# Patient Record
Sex: Female | Born: 2000
Health system: Southern US, Community
[De-identification: ages and names within clinical notes are randomized; demographics above are authoritative.]

## PROBLEM LIST (undated history)

## (undated) DIAGNOSIS — F419 Anxiety disorder, unspecified: Secondary | ICD-10-CM

## (undated) DIAGNOSIS — H93239 Hyperacusis, unspecified ear: Secondary | ICD-10-CM

## (undated) DIAGNOSIS — J45909 Unspecified asthma, uncomplicated: Secondary | ICD-10-CM

## (undated) DIAGNOSIS — T7840XA Allergy, unspecified, initial encounter: Secondary | ICD-10-CM

## (undated) DIAGNOSIS — H9325 Central auditory processing disorder: Secondary | ICD-10-CM

## (undated) DIAGNOSIS — I7300 Raynaud's syndrome without gangrene: Secondary | ICD-10-CM

## (undated) DIAGNOSIS — R413 Other amnesia: Secondary | ICD-10-CM

## (undated) HISTORY — DX: Central auditory processing disorder: H93.25

## (undated) HISTORY — DX: Hyperacusis, unspecified ear: H93.239

## (undated) HISTORY — DX: Unspecified asthma, uncomplicated: J45.909

## (undated) HISTORY — DX: Raynaud's syndrome without gangrene: I73.00

## (undated) HISTORY — DX: Anxiety disorder, unspecified: F41.9

## (undated) HISTORY — DX: Allergy, unspecified, initial encounter: T78.40XA

## (undated) HISTORY — DX: Other amnesia: R41.3

---
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
## 2020-04-22 ENCOUNTER — Telehealth: Payer: Self-pay | Admitting: Family Medicine

## 2020-04-22 MED ORDER — SERTRALINE HCL 50 MG PO TABS: 50.0000 mg | ORAL_TABLET | Freq: Every day | ORAL | 0 refills | Status: DC

## 2020-04-22 NOTE — Telephone Encounter (Signed)
  Prescription Request  04/22/2020  What is the name of the medication or equipment? sertraline (ZOLOFT) 50 MG tablet  Have you contacted your pharmacy to request a refill? (if applicable) no pt needs new rx because she left her medication and realized it when she got home from vacation.  Which pharmacy would you like this sent to? [facility] in [place]   Patient notified that their request is being sent to the clinical staff for review and that they should receive a response within 2 business days.

## 2020-04-23 ENCOUNTER — Other Ambulatory Visit: Payer: Self-pay | Admitting: Family Medicine

## 2020-04-23 MED ORDER — SERTRALINE HCL 50 MG PO TABS: 50.0000 mg | ORAL_TABLET | Freq: Every day | ORAL | 0 refills | Status: DC

## 2020-04-23 NOTE — Telephone Encounter (Signed)
Appears prescription has already been sent.

## 2020-04-23 NOTE — Telephone Encounter (Signed)
Zoloft resent to pharmacy

## 2020-05-20 ENCOUNTER — Other Ambulatory Visit: Payer: Self-pay | Admitting: Family Medicine

## 2020-05-22 ENCOUNTER — Telehealth: Payer: Self-pay | Admitting: Family Medicine

## 2020-05-22 DIAGNOSIS — F411 Generalized anxiety disorder: Secondary | ICD-10-CM

## 2020-05-22 MED ORDER — SERTRALINE HCL 50 MG PO TABS
50.0000 mg | ORAL_TABLET | Freq: Every day | ORAL | 0 refills | Status: DC
Start: 2020-05-22 — End: 2020-08-19

## 2020-05-22 NOTE — Telephone Encounter (Signed)
Changed rx to 90 days and mother notified

## 2020-05-22 NOTE — Telephone Encounter (Signed)
Colleen Mason is asking for a 90 day supply for sertraline (ZOLOFT) 50 MG tablet. Pt says its a insurance issue and needs to be changed. Please call [facility] in [place] to fix.

## 2020-06-26 ENCOUNTER — Ambulatory Visit (INDEPENDENT_AMBULATORY_CARE_PROVIDER_SITE_OTHER): Payer: 59 | Admitting: Family Medicine

## 2020-06-26 VITALS — BP 114/72 | HR 96 | Temp 97.7°F

## 2020-06-26 DIAGNOSIS — J310 Chronic rhinitis: Secondary | ICD-10-CM

## 2020-06-26 DIAGNOSIS — J329 Chronic sinusitis, unspecified: Secondary | ICD-10-CM | POA: Diagnosis not present

## 2020-06-26 MED ORDER — AZELASTINE HCL 0.1 % NA SOLN: 1.0000 | Freq: Two times a day (BID) | NASAL | 12 refills | Status: DC

## 2020-06-26 NOTE — Patient Instructions (Signed)

## 2020-06-26 NOTE — Progress Notes (Signed)
Subjective: CC: URI PCP: Gwenlyn Fudge, FNP XFG:HWEX Werst is a 19 y.o. female presenting to clinic today for:  1. URI Patient reports onset of symptoms on Monday.  She has had rhinorrhea, throat irritation and fever as high as 102 F.  She reports that symptoms seem to be improving but she still has drainage.  She was reluctant to go back to work.  She is not vaccinated.  She has been using all of her antihistamines and using Tylenol if needed.   ROS: Per HPI  Allergies  Allergen Reactions  . Penicillins Hives   Past Medical History:  Diagnosis Date  . Allergy   . Anxiety   . Asthma   . Auditory processing disorder   . Hyperacusis   . Memory difficulty    tolerance fading memory per mother  . Raynaud disease     Current Outpatient Medications:  .  albuterol (VENTOLIN HFA) 108 (90 Base) MCG/ACT inhaler, Inhale 2 puffs into the lungs every 6 (six) hours as needed for wheezing or shortness of breath., Disp: 18 g, Rfl: 5 .  Fluticasone Propionate, Inhal, (FLOVENT DISKUS) 100 MCG/BLIST AEPB, Inhale 1 Inhaler into the lungs 2 (two) times daily., Disp: 180 each, Rfl: 2 .  loratadine (CLARITIN) 10 MG tablet, Take 1 tablet (10 mg total) by mouth daily., Disp: 90 tablet, Rfl: 3 .  montelukast (SINGULAIR) 10 MG tablet, Take 1 tablet (10 mg total) by mouth at bedtime., Disp: 90 tablet, Rfl: 3 .  sertraline (ZOLOFT) 50 MG tablet, Take 1 tablet (50 mg total) by mouth daily., Disp: 90 tablet, Rfl: 3 .  sertraline (ZOLOFT) 50 MG tablet, Take 1 tablet (50 mg total) by mouth daily. (Needs to be seen before next refill), Disp: 90 tablet, Rfl: 0 Social History   Socioeconomic History  . Marital status: Single    Spouse name: Not on file  . Number of children: Not on file  . Years of education: Not on file  . Highest education level: Not on file  Occupational History  . Not on file  Tobacco Use  . Smoking status: Never Smoker  . Smokeless tobacco: Never Used  Vaping Use  . Vaping  Use: Never used  Substance and Sexual Activity  . Alcohol use: Never  . Drug use: Never  . Sexual activity: Not on file  Other Topics Concern  . Not on file  Social History Narrative  . Not on file   Social Determinants of Health   Financial Resource Strain:   . Difficulty of Paying Living Expenses: Not on file  Food Insecurity:   . Worried About Programme researcher, broadcasting/film/video in the Last Year: Not on file  . Ran Out of Food in the Last Year: Not on file  Transportation Needs:   . Lack of Transportation (Medical): Not on file  . Lack of Transportation (Non-Medical): Not on file  Physical Activity:   . Days of Exercise per Week: Not on file  . Minutes of Exercise per Session: Not on file  Stress:   . Feeling of Stress : Not on file  Social Connections:   . Frequency of Communication with Friends and Family: Not on file  . Frequency of Social Gatherings with Friends and Family: Not on file  . Attends Religious Services: Not on file  . Active Member of Clubs or Organizations: Not on file  . Attends Banker Meetings: Not on file  . Marital Status: Not on file  Intimate  Partner Violence:   . Fear of Current or Ex-Partner: Not on file  . Emotionally Abused: Not on file  . Physically Abused: Not on file  . Sexually Abused: Not on file   Family History  Problem Relation Age of Onset  . Raynaud syndrome Mother   . Asthma Mother   . Arthritis Mother        Psoriatic or RA  . Asthma Father   . Raynaud syndrome Sister   . Vitiligo Brother   . Arthritis Maternal Grandmother   . Thyroid disease Maternal Grandfather   . Rheum arthritis Maternal Grandfather   . Heart attack Maternal Grandfather   . Heart disease Maternal Grandfather   . Pancreatic cancer Paternal Grandmother   . Diabetes Paternal Grandmother   . Prostate cancer Paternal Grandfather   . Diabetes Paternal Grandfather   . Breast cancer Other   . Diabetes Other   . Cancer Other        Unknown type  .  Diabetes Other   . Diabetes Maternal Aunt     Objective: Office vital signs reviewed. BP 114/72   Pulse 96   Temp 97.7 F (36.5 C) (Temporal)   Physical Examination:  General: Awake, alert, well nourished, nontoxic. No acute distress HEENT: Normal, sclera white, moist mucous membranes.  Oropharynx with mild erythema.  No tonsillitis.  Nasal turbinates are moist and mildly erythematous. Neck: No posterior cervical lymphadenopathy.  She has mild enlargement of the superior anterior cervical lymph nodes Cardio: regular rate and rhythm, S1S2 heard, no murmurs appreciated Pulm: clear to auscultation bilaterally, no wheezes, rhonchi or rales; normal work of breathing on room air  Assessment/ Plan: 19 y.o. female   1. Rhinosinusitis Check COVID-19.  Astelin added.  Symptoms are improving.  I gave her a work note excusing until her Covid test retuns - Novel Coronavirus, NAA ([facility]) - azelastine (ASTELIN) 0.1 % nasal spray; Place 1 spray into both nostrils 2 (two) times daily.  Dispense: 30 mL; Refill: 12   No orders of the defined types were placed in this encounter.  No orders of the defined types were placed in this encounter.    Raliegh Ip, DO [facility] [phone]

## 2020-06-28 LAB — NOVEL CORONAVIRUS, NAA

## 2020-07-01 ENCOUNTER — Other Ambulatory Visit: Payer: Self-pay

## 2020-07-01 DIAGNOSIS — J310 Chronic rhinitis: Secondary | ICD-10-CM

## 2020-07-01 DIAGNOSIS — J329 Chronic sinusitis, unspecified: Secondary | ICD-10-CM

## 2020-08-19 ENCOUNTER — Other Ambulatory Visit: Payer: Self-pay | Admitting: Family Medicine

## 2020-09-23 ENCOUNTER — Other Ambulatory Visit: Payer: Self-pay | Admitting: Family Medicine

## 2020-09-23 NOTE — Telephone Encounter (Signed)
Appointment scheduled.

## 2020-09-23 NOTE — Telephone Encounter (Signed)
Stacks. NTBS 30 days given 08/19/20

## 2020-10-10 ENCOUNTER — Other Ambulatory Visit: Payer: Self-pay

## 2020-10-10 ENCOUNTER — Encounter: Payer: Self-pay | Admitting: Family Medicine

## 2020-10-10 ENCOUNTER — Ambulatory Visit: Payer: 59 | Admitting: Family Medicine

## 2020-10-10 DIAGNOSIS — J302 Other seasonal allergic rhinitis: Secondary | ICD-10-CM

## 2020-10-10 DIAGNOSIS — J4540 Moderate persistent asthma, uncomplicated: Secondary | ICD-10-CM

## 2020-10-10 MED ORDER — SERTRALINE HCL 50 MG PO TABS
50.0000 mg | ORAL_TABLET | Freq: Every day | ORAL | 3 refills | Status: DC
Start: 2020-10-10 — End: 2021-01-15

## 2020-10-10 MED ORDER — MONTELUKAST SODIUM 10 MG PO TABS: 10.0000 mg | ORAL_TABLET | Freq: Every day | ORAL | 3 refills | Status: DC

## 2020-10-10 MED ORDER — FLOVENT DISKUS 100 MCG/BLIST IN AEPB: 1.0000 | INHALATION_SPRAY | Freq: Two times a day (BID) | RESPIRATORY_TRACT | 2 refills | Status: DC

## 2020-10-10 MED ORDER — LORATADINE 10 MG PO TABS: 10.0000 mg | ORAL_TABLET | Freq: Every day | ORAL | 3 refills | Status: DC

## 2020-10-10 MED ORDER — ALBUTEROL SULFATE HFA 108 (90 BASE) MCG/ACT IN AERS: 2.0000 | INHALATION_SPRAY | Freq: Four times a day (QID) | RESPIRATORY_TRACT | 3 refills | Status: DC | PRN

## 2020-10-10 NOTE — Progress Notes (Signed)
Subjective:  Patient ID: Colleen Mason, female    DOB: 06-11-2001  Age: 20 y.o. MRN: [id number]  CC: Medical Management of Chronic Issues   HPI [name] presents for follow-up on her allergy and asthma as well as chronic anxiety.  She has been using the Flovent as needed as well as the albuterol as needed she has no symptoms currently so she is not taking the Singulair.  She does continue the sertraline for the anxiety on a regular basis.  Her allergy and asthma symptoms tend to be seasonal.  More in the summer.    Depression screen [facility] 2/9 10/10/2020 12/06/2019 09/19/2019  Decreased Interest 0 0 0  Down, Depressed, Hopeless 0 0 0  PHQ - 2 Score 0 0 0  Altered sleeping - 0 -  Tired, decreased energy - 0 -  Change in appetite - 0 -  Feeling bad or failure about yourself  - 0 -  Trouble concentrating - 0 -  Moving slowly or fidgety/restless - 0 -  Suicidal thoughts - 0 -  PHQ-9 Score - 0 -    History Colleen Mason has a past medical history of Allergy, Anxiety, Asthma, Auditory processing disorder, Hyperacusis, Memory difficulty, and Raynaud disease.   She has no past surgical history on file.   Her family history includes Arthritis in her maternal grandmother and mother; Asthma in her father and mother; Breast cancer in an other family member; Cancer in an other family member; Diabetes in her maternal aunt, paternal grandfather, paternal grandmother, and other family members; Heart attack in her maternal grandfather; Heart disease in her maternal grandfather; Pancreatic cancer in her paternal grandmother; Prostate cancer in her paternal grandfather; Raynaud syndrome in her mother and sister; Rheum arthritis in her maternal grandfather; Thyroid disease in her maternal grandfather; Vitiligo in her brother.She reports that she has never smoked. She has never used smokeless tobacco. She reports that she does not drink alcohol and does not use drugs.    ROS Review of Systems  Constitutional:  Negative.   HENT: Negative.   Eyes: Negative for visual disturbance.  Respiratory: Negative for shortness of breath.   Cardiovascular: Negative for chest pain.  Gastrointestinal: Negative for abdominal pain.  Musculoskeletal: Negative for arthralgias.    Objective:  BP 111/68   Pulse 78   Temp 98.2 F (36.8 C) (Temporal)   Ht 5' 4.53" (1.639 m)   Wt 113 lb 3.2 oz (51.3 kg)   BMI 19.11 kg/m   BP Readings from Last 3 Encounters:  10/10/20 111/68  06/26/20 114/72  12/06/19 129/73    Wt Readings from Last 3 Encounters:  10/10/20 113 lb 3.2 oz (51.3 kg) (21 %, Z= -0.81)*  12/06/19 116 lb (52.6 kg) (29 %, Z= -0.54)*  09/19/19 115 lb 3.2 oz (52.3 kg) (29 %, Z= -0.56)*   * Growth percentiles are based on CDC (Girls, 2-20 Years) data.     Physical Exam Constitutional:      General: She is not in acute distress.    Appearance: She is well-developed and well-nourished.  Cardiovascular:     Rate and Rhythm: Normal rate and regular rhythm.  Pulmonary:     Breath sounds: Normal breath sounds.  Skin:    General: Skin is warm and dry.  Neurological:     Mental Status: She is alert and oriented to person, place, and time.  Psychiatric:        Mood and Affect: Mood and affect normal.  Assessment & Plan:   Azizi was seen today for medical management of chronic issues.  Diagnoses and all orders for this visit:  Moderate persistent asthma without complication -     montelukast (SINGULAIR) 10 MG tablet; Take 1 tablet (10 mg total) by mouth at bedtime. -     albuterol (VENTOLIN HFA) 108 (90 Base) MCG/ACT inhaler; Inhale 2 puffs into the lungs every 6 (six) hours as needed for wheezing or shortness of breath. -     Fluticasone Propionate, Inhal, (FLOVENT DISKUS) 100 MCG/BLIST AEPB; Inhale 1 Inhaler into the lungs 2 (two) times daily. -     loratadine (CLARITIN) 10 MG tablet; Take 1 tablet (10 mg total) by mouth daily.  Seasonal allergies -     loratadine (CLARITIN) 10 MG  tablet; Take 1 tablet (10 mg total) by mouth daily.  Other orders -     sertraline (ZOLOFT) 50 MG tablet; Take 1 tablet (50 mg total) by mouth daily. (Needs to be seen before next refill)       I have discontinued Miliani Wadsworth's azelastine. I am also having her maintain her sertraline, montelukast, albuterol, Flovent Diskus, and loratadine.  Allergies as of 10/10/2020      Reactions   Penicillins Hives      Medication List       Accurate as of October 10, 2020 11:59 PM. If you have any questions, ask your nurse or doctor.        STOP taking these medications   azelastine 0.1 % nasal spray Commonly known as: ASTELIN Stopped by: Mechele Claude, MD     TAKE these medications   albuterol 108 (90 Base) MCG/ACT inhaler Commonly known as: VENTOLIN HFA Inhale 2 puffs into the lungs every 6 (six) hours as needed for wheezing or shortness of breath.   Flovent Diskus 100 MCG/BLIST Aepb Generic drug: Fluticasone Propionate (Inhal) Inhale 1 Inhaler into the lungs 2 (two) times daily.   loratadine 10 MG tablet Commonly known as: CLARITIN Take 1 tablet (10 mg total) by mouth daily.   montelukast 10 MG tablet Commonly known as: SINGULAIR Take 1 tablet (10 mg total) by mouth at bedtime.   sertraline 50 MG tablet Commonly known as: ZOLOFT Take 1 tablet (50 mg total) by mouth daily. (Needs to be seen before next refill)        Follow-up: Return in about 1 year (around 10/10/2021).  Mechele Claude, M.D.

## 2020-10-13 ENCOUNTER — Encounter: Payer: Self-pay | Admitting: Family Medicine

## 2021-01-03 ENCOUNTER — Ambulatory Visit: Payer: 59 | Admitting: Nurse Practitioner

## 2021-01-03 ENCOUNTER — Encounter: Payer: Self-pay | Admitting: Nurse Practitioner

## 2021-01-03 ENCOUNTER — Other Ambulatory Visit: Payer: Self-pay

## 2021-01-03 VITALS — BP 109/74 | HR 106 | Temp 97.6°F | Ht 64.0 in | Wt 110.0 lb

## 2021-01-03 DIAGNOSIS — R21 Rash and other nonspecific skin eruption: Secondary | ICD-10-CM | POA: Diagnosis not present

## 2021-01-03 MED ORDER — PREDNISONE 10 MG (21) PO TBPK: ORAL_TABLET | ORAL | 0 refills | Status: DC

## 2021-01-03 MED ORDER — HYDROCORTISONE 1 % EX LOTN: 1.0000 "application " | TOPICAL_LOTION | Freq: Two times a day (BID) | CUTANEOUS | 0 refills | Status: DC

## 2021-01-03 NOTE — Patient Instructions (Signed)
Rash, Adult  A rash is a change in the color of your skin. A rash can also change the way your skin feels. There are many different conditions and factors that can cause a rash. Follow these instructions at home: The goal of treatment is to stop the itching and keep the rash from spreading. Watch for any changes in your symptoms. Let your doctor know about them. Follow these instructions to help with your condition: Medicine Take or apply over-the-counter and prescription medicines only as told by your doctor. These may include medicines:  To treat red or swollen skin (corticosteroid creams).  To treat itching.  To treat an allergy (oral antihistamines).  To treat very bad symptoms (oral corticosteroids).   Skin care  Put cool cloths (compresses) on the affected areas.  Do not scratch or rub your skin.  Avoid covering the rash. Make sure that the rash is exposed to air as much as possible. Managing itching and discomfort  Avoid hot showers or baths. These can make itching worse. A cold shower may help.  Try taking a bath with: ? Epsom salts. You can get these at your local pharmacy or grocery store. Follow the instructions on the package. ? Baking soda. Pour a small amount into the bath as told by your doctor. ? Colloidal oatmeal. You can get this at your local pharmacy or grocery store. Follow the instructions on the package.  Try putting baking soda paste onto your skin. Stir water into baking soda until it gets like a paste.  Try putting on a lotion that relieves itchiness (calamine lotion).  Keep cool and out of the sun. Sweating and being hot can make itching worse. General instructions  Rest as needed.  Drink enough fluid to keep your pee (urine) pale yellow.  Wear loose-fitting clothing.  Avoid scented soaps, detergents, and perfumes. Use gentle soaps, detergents, perfumes, and other cosmetic products.  Avoid anything that causes your rash. Keep a journal to help  track what causes your rash. Write down: ? What you eat. ? What cosmetic products you use. ? What you drink. ? What you wear. This includes jewelry.  Keep all follow-up visits as told by your doctor. This is important.   Contact a doctor if:  You sweat at night.  You lose weight.  You pee (urinate) more than normal.  You pee less than normal, or you notice that your pee is a darker color than normal.  You feel weak.  You throw up (vomit).  Your skin or the whites of your eyes look yellow (jaundice).  Your skin: ? Tingles. ? Is numb.  Your rash: ? Does not go away after a few days. ? Gets worse.  You are: ? More thirsty than normal. ? More tired than normal.  You have: ? New symptoms. ? Pain in your belly (abdomen). ? A fever. ? Watery poop (diarrhea). Get help right away if:  You have a fever and your symptoms suddenly get worse.  You start to feel mixed up (confused).  You have a very bad headache or a stiff neck.  You have very bad joint pains or stiffness.  You have jerky movements that you cannot control (seizure).  Your rash covers all or most of your body. The rash may or may not be painful.  You have blisters that: ? Are on top of the rash. ? Grow larger. ? Grow together. ? Are painful. ? Are inside your nose or mouth.  You have   a rash that: ? Looks like purple pinprick-sized spots all over your body. ? Has a "bull's eye" or looks like a target. ? Is red and painful, causes your skin to peel, and is not from being in the sun too long. Summary  A rash is a change in the color of your skin. A rash can also change the way your skin feels.  The goal of treatment is to stop the itching and keep the rash from spreading.  Take or apply over-the-counter and prescription medicines only as told by your doctor.  Contact a doctor if you have new symptoms or symptoms that get worse.  Keep all follow-up visits as told by your doctor. This is  important. This information is not intended to replace advice given to you by your health care provider. Make sure you discuss any questions you have with your health care provider. Document Revised: 12/16/2018 Document Reviewed: 03/28/2018 Elsevier Patient Education  2021 Elsevier Inc.  

## 2021-01-03 NOTE — Assessment & Plan Note (Signed)
Rash gradually resolving.  Symptoms present at least 4 weeks.  Prednisone taper, hydrocortisone cream.  Advised to take medication as prescribed.  Education provided with printed handouts given.  Rx sent to pharmacy.  Patient knows to follow-up with worsening symptoms.

## 2021-01-03 NOTE — Progress Notes (Signed)
Acute Office Visit  Subjective:    Patient ID: Colleen Mason, female    DOB: 2001-04-06, 20 y.o.   MRN: [id number]  Chief Complaint  Patient presents with  . Rash    Rash This is a new problem. The current episode started 1 to 4 weeks ago. The problem has been gradually improving since onset. The affected locations include the torso and left buttock. The rash is characterized by itchiness and redness. She was exposed to nothing. Pertinent negatives include no facial edema, fatigue, fever or shortness of breath. Past treatments include topical steroids. The treatment provided moderate relief. Her past medical history is significant for allergies and asthma.     Past Medical History:  Diagnosis Date  . Allergy   . Anxiety   . Asthma   . Auditory processing disorder   . Hyperacusis   . Memory difficulty    tolerance fading memory per mother  . Raynaud disease     History reviewed. No pertinent surgical history.  Family History  Problem Relation Age of Onset  . Raynaud syndrome Mother   . Asthma Mother   . Arthritis Mother        Psoriatic or RA  . Asthma Father   . Raynaud syndrome Sister   . Vitiligo Brother   . Arthritis Maternal Grandmother   . Thyroid disease Maternal Grandfather   . Rheum arthritis Maternal Grandfather   . Heart attack Maternal Grandfather   . Heart disease Maternal Grandfather   . Pancreatic cancer Paternal Grandmother   . Diabetes Paternal Grandmother   . Prostate cancer Paternal Grandfather   . Diabetes Paternal Grandfather   . Breast cancer Other   . Diabetes Other   . Cancer Other        Unknown type  . Diabetes Other   . Diabetes Maternal Aunt     Social History   Socioeconomic History  . Marital status: Single    Spouse name: Not on file  . Number of children: Not on file  . Years of education: Not on file  . Highest education level: Not on file  Occupational History  . Not on file  Tobacco Use  . Smoking status: Never Smoker   . Smokeless tobacco: Never Used  Vaping Use  . Vaping Use: Never used  Substance and Sexual Activity  . Alcohol use: Never  . Drug use: Never  . Sexual activity: Not on file  Other Topics Concern  . Not on file  Social History Narrative  . Not on file   Social Determinants of Health   Financial Resource Strain: Not on file  Food Insecurity: Not on file  Transportation Needs: Not on file  Physical Activity: Not on file  Stress: Not on file  Social Connections: Not on file  Intimate Partner Violence: Not on file    Outpatient Medications Prior to Visit  Medication Sig Dispense Refill  . albuterol (VENTOLIN HFA) 108 (90 Base) MCG/ACT inhaler Inhale 2 puffs into the lungs every 6 (six) hours as needed for wheezing or shortness of breath. 54 g 3  . Fluticasone Propionate, Inhal, (FLOVENT DISKUS) 100 MCG/BLIST AEPB Inhale 1 Inhaler into the lungs 2 (two) times daily. 180 each 2  . loratadine (CLARITIN) 10 MG tablet Take 1 tablet (10 mg total) by mouth daily. 90 tablet 3  . montelukast (SINGULAIR) 10 MG tablet Take 1 tablet (10 mg total) by mouth at bedtime. 90 tablet 3  . sertraline (ZOLOFT) 50 MG tablet  Take 1 tablet (50 mg total) by mouth daily. (Needs to be seen before next refill) 90 tablet 3   No facility-administered medications prior to visit.    Allergies  Allergen Reactions  . Penicillins Hives    Review of Systems  Constitutional: Negative for fatigue and fever.  Respiratory: Negative for shortness of breath.   Skin: Positive for rash.  All other systems reviewed and are negative.      Objective:    Physical Exam Vitals and nursing note reviewed.  Constitutional:      Appearance: Normal appearance.  HENT:     Head: Normocephalic.     Nose: Nose normal.  Eyes:     Conjunctiva/sclera: Conjunctivae normal.  Cardiovascular:     Rate and Rhythm: Normal rate and regular rhythm.     Pulses: Normal pulses.     Heart sounds: Normal heart sounds.  Pulmonary:      Effort: Pulmonary effort is normal.     Breath sounds: Normal breath sounds.  Abdominal:     General: Bowel sounds are normal.  Skin:    General: Skin is warm.     Findings: Erythema and rash present. Rash is urticarial.          Comments: Red area,   Neurological:     Mental Status: She is alert and oriented to person, place, and time.  Psychiatric:        Behavior: Behavior normal.     BP 109/74   Pulse (!) 106   Temp 97.6 F (36.4 C) (Temporal)   Ht 5\' 4"  (1.626 m)   Wt 110 lb (49.9 kg)   LMP 12/07/2020   SpO2 98%   BMI 18.88 kg/m  Wt Readings from Last 3 Encounters:  01/03/21 110 lb (49.9 kg) (15 %, Z= -1.04)*  10/10/20 113 lb 3.2 oz (51.3 kg) (21 %, Z= -0.81)*  12/06/19 116 lb (52.6 kg) (29 %, Z= -0.54)*   * Growth percentiles are based on CDC (Girls, 2-20 Years) data.    Health Maintenance Due  Topic Date Due  . Hepatitis C Screening  Never done  . HPV VACCINES (1 - 2-dose series) Never done  . HIV Screening  Never done       Topic Date Due  . HPV VACCINES (1 - 2-dose series) Never done        Assessment & Plan:   Problem List Items Addressed This Visit      Musculoskeletal and Integument   Rash - Primary    Rash gradually resolving.  Symptoms present at least 4 weeks.  Prednisone taper, hydrocortisone cream.  Advised to take medication as prescribed.  Education provided with printed handouts given.  Rx sent to pharmacy.  Patient knows to follow-up with worsening symptoms.      Relevant Medications   predniSONE (STERAPRED UNI-PAK 21 TAB) 10 MG (21) TBPK tablet   hydrocortisone 1 % lotion       Meds ordered this encounter  Medications  . predniSONE (STERAPRED UNI-PAK 21 TAB) 10 MG (21) TBPK tablet    Sig: 6 tablets day 1, 5 tablets day 2, 4 tablet day 3, 3 tablet day 4, 2 tablet day 5, 1 tablet day 6    Dispense:  21 tablet    Refill:  0    Order Specific Question:   Supervising Provider    Answer:   12/08/19 Raliegh Ip  .  hydrocortisone 1 % lotion    Sig: Apply 1 application  topically 2 (two) times daily.    Dispense:  118 mL    Refill:  0    Order Specific Question:   Supervising Provider    Answer:   Raliegh Ip [[id number]]     Daryll Drown, NP

## 2021-01-15 ENCOUNTER — Encounter: Payer: Self-pay | Admitting: Family Medicine

## 2021-01-15 ENCOUNTER — Ambulatory Visit: Payer: 59 | Admitting: Family Medicine

## 2021-01-15 ENCOUNTER — Other Ambulatory Visit: Payer: Self-pay

## 2021-01-15 VITALS — BP 106/68 | HR 89 | Temp 97.7°F | Ht 64.0 in | Wt 113.4 lb

## 2021-01-15 DIAGNOSIS — F411 Generalized anxiety disorder: Secondary | ICD-10-CM | POA: Diagnosis not present

## 2021-01-15 DIAGNOSIS — Z30011 Encounter for initial prescription of contraceptive pills: Secondary | ICD-10-CM | POA: Diagnosis not present

## 2021-01-15 LAB — PREGNANCY, URINE: Preg Test, Ur: NEGATIVE

## 2021-01-15 MED ORDER — SERTRALINE HCL 100 MG PO TABS: 100.0000 mg | ORAL_TABLET | Freq: Every day | ORAL | 3 refills | Status: DC

## 2021-01-15 MED ORDER — NORETHINDRONE ACET-ETHINYL EST 1-20 MG-MCG PO TABS
1.0000 | ORAL_TABLET | Freq: Every day | ORAL | 11 refills | Status: DC
Start: 2021-01-15 — End: 2021-12-16

## 2021-01-15 NOTE — Progress Notes (Signed)
Assessment & Plan:  1. Generalized anxiety disorder Uncontrolled. Sertraline increased from 50 mg to 100 mg.  - sertraline (ZOLOFT) 100 MG tablet; Take 1 tablet (100 mg total) by mouth daily.  Dispense: 30 tablet; Refill: 3  2. Encounter for prescription of oral contraceptives Started oral birth control. Discussed appropriate use.  - Pregnancy, urine (negative)  - norethindrone-ethinyl estradiol (LOESTRIN 1/20, 21,) 1-20 MG-MCG tablet; Take 1 tablet by mouth daily.  Dispense: 28 tablet; Refill: 11   Return in about 6 weeks (around 02/26/2021) for [facility] & anxiety.  Hendricks Limes, MSN, APRN, FNP-C [facility]  Subjective:    Patient ID: Levada Bowersox, female    DOB: 02-02-2001, 20 y.o.   MRN: [id number]  Patient Care Team: Loman Brooklyn, FNP as PCP - General (Family Medicine)   Chief Complaint:  Chief Complaint  Patient presents with  . Anxiety    Medication check   . Contraception    Options     HPI: Nastashia Gallo is a 20 y.o. female presenting on 01/15/2021 for Anxiety (Medication check ) and Contraception (Options )  Patient reports feeling more bummed out over the past month and a half. She would like to increase her sertraline.   Depression screen [facility] 2/9 01/15/2021 01/03/2021 10/10/2020  Decreased Interest 1 0 0  Down, Depressed, Hopeless 0 0 0  PHQ - 2 Score 1 0 0  Altered sleeping 1 0 -  Tired, decreased energy 0 0 -  Change in appetite 0 0 -  Feeling bad or failure about yourself  0 0 -  Trouble concentrating 0 0 -  Moving slowly or fidgety/restless 0 0 -  Suicidal thoughts 0 0 -  PHQ-9 Score 2 0 -   GAD 7 : Generalized Anxiety Score 01/15/2021 07/19/2019  Nervous, Anxious, on Edge 1 0  Control/stop worrying 0 0  Worry too much - different things 1 0  Trouble relaxing 0 0  Restless 0 0  Easily annoyed or irritable 0 0  Afraid - awful might happen 0 0  Total GAD 7 Score 2 0  Anxiety Difficulty Not difficult at all -    New  complaints: Patient is interested in birth control for pregnancy prevention. She has never been on anything in the past.    Social history:  Relevant past medical, surgical, family and social history reviewed and updated as indicated. Interim medical history since our last visit reviewed.  Allergies and medications reviewed and updated.  DATA REVIEWED: CHART IN EPIC  ROS: Negative unless specifically indicated above in HPI.    Current Outpatient Medications:  .  albuterol (VENTOLIN HFA) 108 (90 Base) MCG/ACT inhaler, Inhale 2 puffs into the lungs every 6 (six) hours as needed for wheezing or shortness of breath., Disp: 54 g, Rfl: 3 .  Fluticasone Propionate, Inhal, (FLOVENT DISKUS) 100 MCG/BLIST AEPB, Inhale 1 Inhaler into the lungs 2 (two) times daily., Disp: 180 each, Rfl: 2 .  sertraline (ZOLOFT) 50 MG tablet, Take 1 tablet (50 mg total) by mouth daily. (Needs to be seen before next refill), Disp: 90 tablet, Rfl: 3   Allergies  Allergen Reactions  . Penicillins Hives   Past Medical History:  Diagnosis Date  . Allergy   . Anxiety   . Asthma   . Auditory processing disorder   . Hyperacusis   . Memory difficulty    tolerance fading memory per mother  . Raynaud disease     History reviewed. No pertinent surgical  history.  Social History   Socioeconomic History  . Marital status: Single    Spouse name: Not on file  . Number of children: Not on file  . Years of education: Not on file  . Highest education level: Not on file  Occupational History  . Not on file  Tobacco Use  . Smoking status: Never Smoker  . Smokeless tobacco: Never Used  Vaping Use  . Vaping Use: Never used  Substance and Sexual Activity  . Alcohol use: Never  . Drug use: Never  . Sexual activity: Not on file  Other Topics Concern  . Not on file  Social History Narrative  . Not on file   Social Determinants of Health   Financial Resource Strain: Not on file  Food Insecurity: Not on file   Transportation Needs: Not on file  Physical Activity: Not on file  Stress: Not on file  Social Connections: Not on file  Intimate Partner Violence: Not on file        Objective:    BP 106/68   Pulse 89   Temp 97.7 F (36.5 C) (Temporal)   Ht 5' 4"  (1.626 m)   Wt 113 lb 6.4 oz (51.4 kg)   LMP 01/06/2021 (Approximate)   SpO2 100%   BMI 19.47 kg/m   Wt Readings from Last 3 Encounters:  01/15/21 113 lb 6.4 oz (51.4 kg) (21 %, Z= -0.81)*  01/03/21 110 lb (49.9 kg) (15 %, Z= -1.04)*  10/10/20 113 lb 3.2 oz (51.3 kg) (21 %, Z= -0.81)*   * Growth percentiles are based on CDC (Girls, 2-20 Years) data.    Physical Exam Vitals reviewed.  Constitutional:      General: She is not in acute distress.    Appearance: Normal appearance. She is not ill-appearing, toxic-appearing or diaphoretic.  HENT:     Head: Normocephalic and atraumatic.  Eyes:     General: No scleral icterus.       Right eye: No discharge.        Left eye: No discharge.     Conjunctiva/sclera: Conjunctivae normal.  Cardiovascular:     Rate and Rhythm: Normal rate.  Pulmonary:     Effort: Pulmonary effort is normal. No respiratory distress.  Musculoskeletal:        General: Normal range of motion.     Cervical back: Normal range of motion.  Skin:    General: Skin is warm and dry.     Capillary Refill: Capillary refill takes less than 2 seconds.  Neurological:     General: No focal deficit present.     Mental Status: She is alert and oriented to person, place, and time. Mental status is at baseline.  Psychiatric:        Mood and Affect: Mood normal.        Behavior: Behavior normal.        Thought Content: Thought content normal.        Judgment: Judgment normal.     No results found for: TSH No results found for: WBC, HGB, HCT, MCV, PLT No results found for: NA, K, CHLORIDE, CO2, GLUCOSE, BUN, CREATININE, BILITOT, ALKPHOS, AST, ALT, PROT, ALBUMIN, CALCIUM, ANIONGAP, EGFR, GFR No results found for:  CHOL No results found for: HDL No results found for: LDLCALC No results found for: TRIG No results found for: CHOLHDL No results found for: HGBA1C

## 2021-02-11 ENCOUNTER — Encounter: Payer: Self-pay | Admitting: Nurse Practitioner

## 2021-02-11 ENCOUNTER — Other Ambulatory Visit: Payer: Self-pay

## 2021-02-11 ENCOUNTER — Ambulatory Visit: Payer: 59 | Admitting: Nurse Practitioner

## 2021-02-11 VITALS — BP 122/73 | HR 120 | Temp 97.7°F | Ht 64.0 in | Wt 112.0 lb

## 2021-02-11 DIAGNOSIS — L02612 Cutaneous abscess of left foot: Secondary | ICD-10-CM | POA: Insufficient documentation

## 2021-02-11 MED ORDER — DOXYCYCLINE HYCLATE 100 MG PO TABS: 100.0000 mg | ORAL_TABLET | Freq: Two times a day (BID) | ORAL | 0 refills | Status: DC

## 2021-02-11 NOTE — Progress Notes (Signed)
Acute Office Visit  Subjective:    Patient ID: Colleen Mason, female    DOB: Oct 10, 2000, 20 y.o.   MRN: [id number]  Chief Complaint  Patient presents with  . skin lesion    HPI Patient is a 20 year old female who presents to clinic for left foot abscess that developed from an ingrown hair after visiting a pedicure parlor.  Patient reports she had her skin scrubbed with an abrasive scrub that left her with a little pimple-like lesion.  Few weeks later the spot started weeping with pus and skin around it grew darker.  No fever, pain, itching, swelling or irritation.  Past Medical History:  Diagnosis Date  . Allergy   . Anxiety   . Asthma   . Auditory processing disorder   . Hyperacusis   . Memory difficulty    tolerance fading memory per mother  . Raynaud disease     No past surgical history on file.  Family History  Problem Relation Age of Onset  . Raynaud syndrome Mother   . Asthma Mother   . Arthritis Mother        Psoriatic or RA  . Asthma Father   . Raynaud syndrome Sister   . Vitiligo Brother   . Arthritis Maternal Grandmother   . Thyroid disease Maternal Grandfather   . Rheum arthritis Maternal Grandfather   . Heart attack Maternal Grandfather   . Heart disease Maternal Grandfather   . Pancreatic cancer Paternal Grandmother   . Diabetes Paternal Grandmother   . Prostate cancer Paternal Grandfather   . Diabetes Paternal Grandfather   . Breast cancer Other   . Diabetes Other   . Cancer Other        Unknown type  . Diabetes Other   . Diabetes Maternal Aunt     Social History   Socioeconomic History  . Marital status: Single    Spouse name: Not on file  . Number of children: Not on file  . Years of education: Not on file  . Highest education level: Not on file  Occupational History  . Not on file  Tobacco Use  . Smoking status: Never Smoker  . Smokeless tobacco: Never Used  Vaping Use  . Vaping Use: Never used  Substance and Sexual Activity  .  Alcohol use: Never  . Drug use: Never  . Sexual activity: Not on file  Other Topics Concern  . Not on file  Social History Narrative  . Not on file   Social Determinants of Health   Financial Resource Strain: Not on file  Food Insecurity: Not on file  Transportation Needs: Not on file  Physical Activity: Not on file  Stress: Not on file  Social Connections: Not on file  Intimate Partner Violence: Not on file    Outpatient Medications Prior to Visit  Medication Sig Dispense Refill  . albuterol (VENTOLIN HFA) 108 (90 Base) MCG/ACT inhaler Inhale 2 puffs into the lungs every 6 (six) hours as needed for wheezing or shortness of breath. 54 g 3  . Fluticasone Propionate, Inhal, (FLOVENT DISKUS) 100 MCG/BLIST AEPB Inhale 1 Inhaler into the lungs 2 (two) times daily. 180 each 2  . norethindrone-ethinyl estradiol (LOESTRIN 1/20, 21,) 1-20 MG-MCG tablet Take 1 tablet by mouth daily. 28 tablet 11  . sertraline (ZOLOFT) 100 MG tablet Take 1 tablet (100 mg total) by mouth daily. 30 tablet 3   No facility-administered medications prior to visit.    Allergies  Allergen Reactions  . Penicillins  Hives    Review of Systems  Constitutional: Negative.   HENT: Negative.   Respiratory: Negative.   Cardiovascular: Negative.   Gastrointestinal: Negative.   Genitourinary: Negative.   Musculoskeletal: Negative.   Skin: Positive for color change.  All other systems reviewed and are negative.      Objective:    Physical Exam Vitals reviewed.  HENT:     Head: Normocephalic.     Nose: Nose normal.  Eyes:     Conjunctiva/sclera: Conjunctivae normal.  Cardiovascular:     Rate and Rhythm: Normal rate and regular rhythm.  Pulmonary:     Effort: Pulmonary effort is normal.     Breath sounds: Normal breath sounds.  Abdominal:     General: Bowel sounds are normal.  Musculoskeletal:        General: Normal range of motion.  Skin:    General: Skin is warm.     Findings: Lesion present.   Neurological:     Mental Status: She is alert and oriented to person, place, and time.     BP 122/73   Pulse (!) 120   Temp 97.7 F (36.5 C) (Temporal)   Ht 5' 4"  (1.626 m)   Wt 112 lb (50.8 kg)   SpO2 100%   BMI 19.22 kg/m  Wt Readings from Last 3 Encounters:  02/11/21 112 lb (50.8 kg)  01/15/21 113 lb 6.4 oz (51.4 kg) (21 %, Z= -0.81)*  01/03/21 110 lb (49.9 kg) (15 %, Z= -1.04)*   * Growth percentiles are based on CDC (Girls, 2-20 Years) data.    Health Maintenance Due  Topic Date Due  . Pneumococcal Vaccine 70-15 Years old (1 of 2 - PPSV23) Never done    There are no preventive care reminders to display for this patient.   No results found for: TSH No results found for: WBC, HGB, HCT, MCV, PLT No results found for: NA, K, CHLORIDE, CO2, GLUCOSE, BUN, CREATININE, BILITOT, ALKPHOS, AST, ALT, PROT, ALBUMIN, CALCIUM, ANIONGAP, EGFR, GFR No results found for: CHOL No results found for: HDL No results found for: LDLCALC No results found for: TRIG No results found for: CHOLHDL No results found for: HGBA1C     Assessment & Plan:   Problem List Items Addressed This Visit      Musculoskeletal and Integument   Cutaneous abscess of left foot - Primary    Symptoms not well controlled, lesion on left lateral lower leg that started out as a pimple-like spot and progressed to an abscess with pus has not yet resolved.  On assessment patient may have ingrown hair covered with pus.  started patient on doxycycline 100 mg tablet twice daily.  Education provided to patient with printed handouts given.  Advised patient to follow-up with worsening unresolved symptoms.  Dermatology referral in the future may be a resource.  Rx sent to pharmacy.      Relevant Medications   doxycycline (VIBRA-TABS) 100 MG tablet       Meds ordered this encounter  Medications  . doxycycline (VIBRA-TABS) 100 MG tablet    Sig: Take 1 tablet (100 mg total) by mouth 2 (two) times daily.     Dispense:  14 tablet    Refill:  0    Order Specific Question:   Supervising Provider    Answer:   Janora Norlander [[id number]]     Ivy Lynn, NP

## 2021-02-11 NOTE — Assessment & Plan Note (Signed)
Symptoms not well controlled, lesion on left lateral lower leg that started out as a pimple-like spot and progressed to an abscess with pus has not yet resolved.  On assessment patient may have ingrown hair covered with pus.  started patient on doxycycline 100 mg tablet twice daily.  Education provided to patient with printed handouts given.  Advised patient to follow-up with worsening unresolved symptoms.  Dermatology referral in the future may be a resource.  Rx sent to pharmacy.

## 2021-02-11 NOTE — Patient Instructions (Signed)

## 2021-02-20 IMAGING — DX DG LUMBAR SPINE 2-3V
2 series · 2 of 2 positions shown · non-contrast
Comparison: None.

CLINICAL DATA: Pain after horseback riding

EXAM:
LUMBAR SPINE - 2-3 VIEW

[l-spine ap]
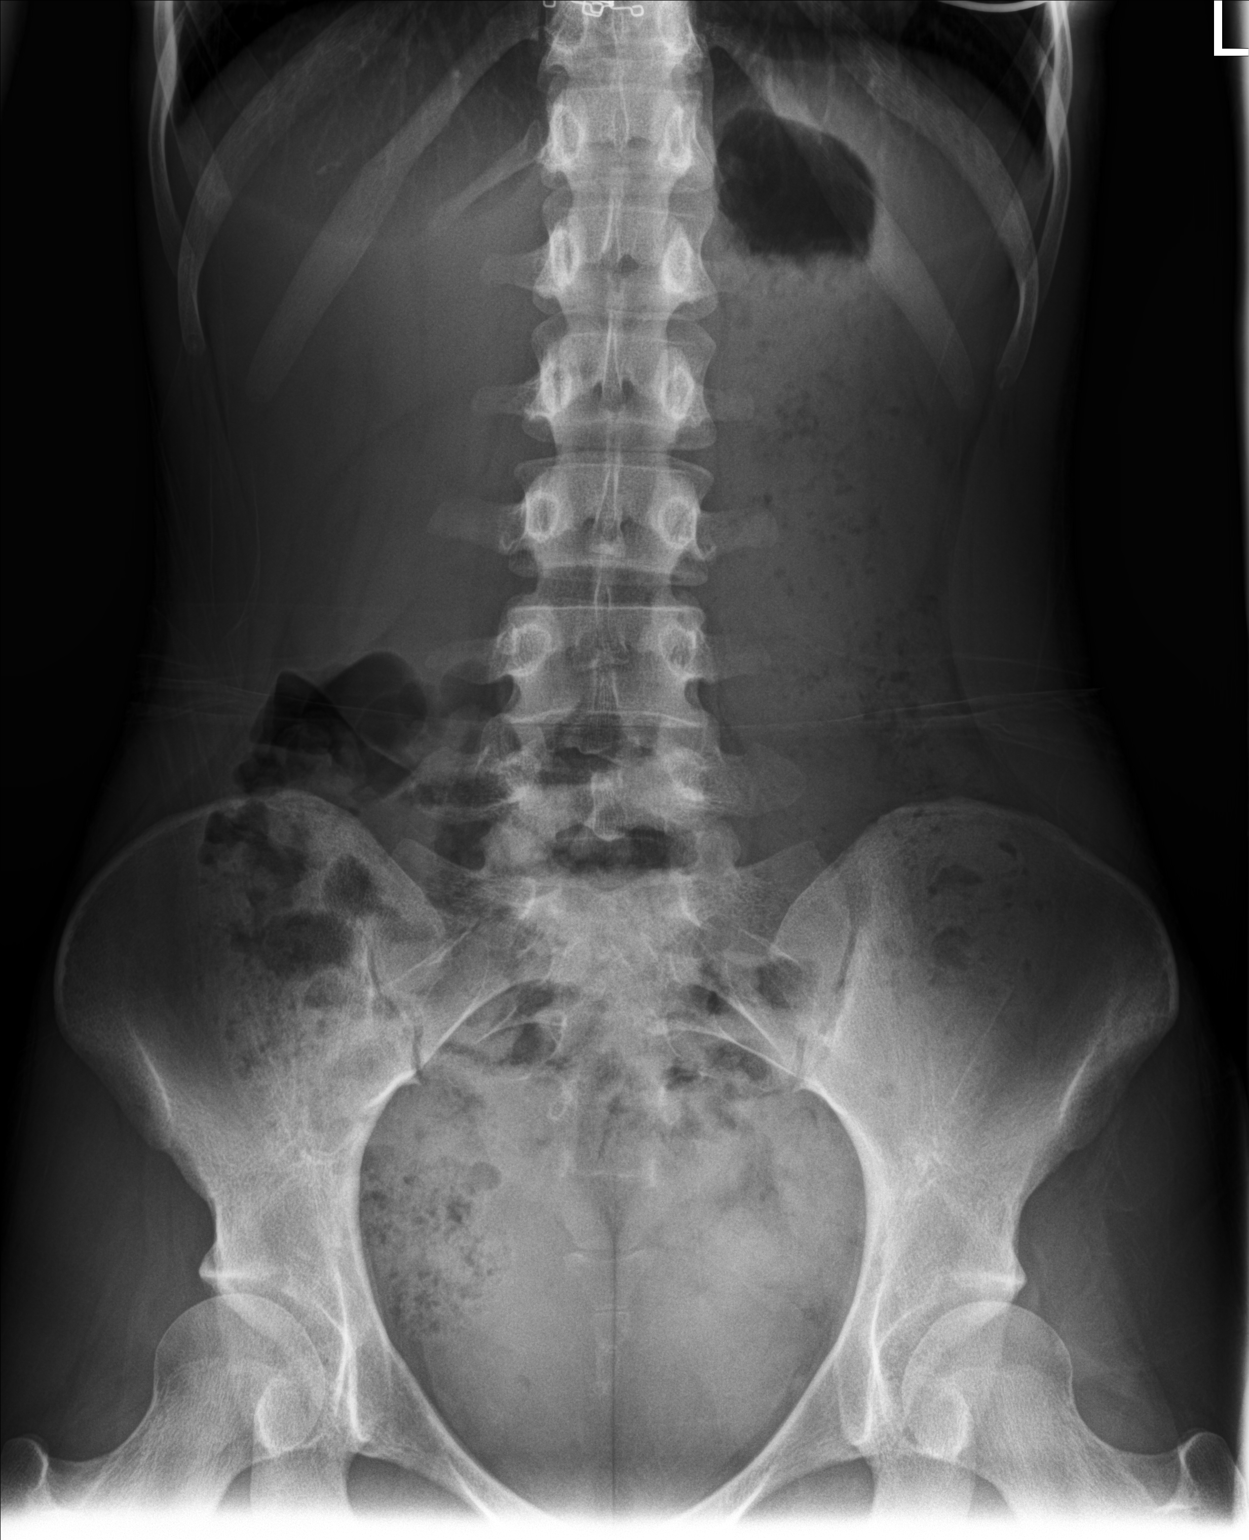

[l-spine lat]
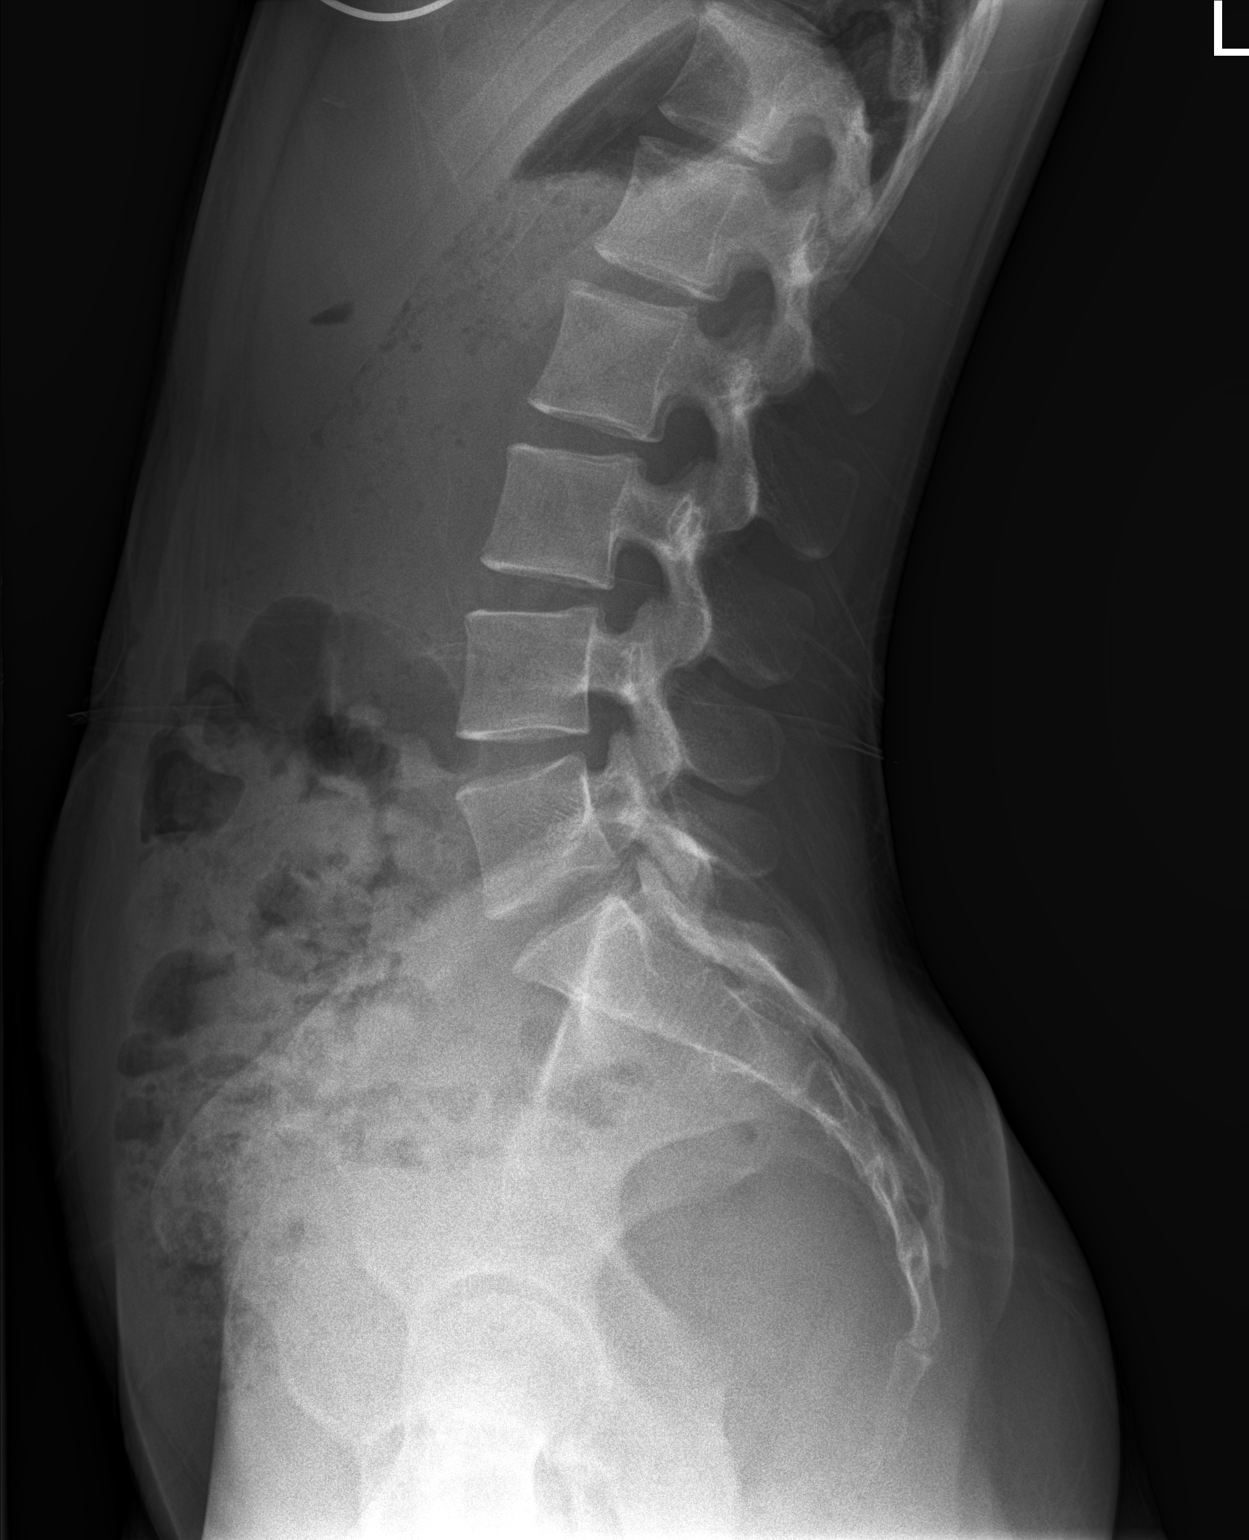

[2 of 2 positions shown; findings below may reference images not displayed]

FINDINGS: Frontal and lateral views were obtained. There are 5 non-rib-bearing
lumbar type vertebral bodies. T12 ribs are hypoplastic. No fracture
or spondylolisthesis. Disc spaces appear unremarkable. No erosive
change.
IMPRESSION: No fracture or spondylolisthesis.  No appreciable arthropathy.

## 2021-02-28 ENCOUNTER — Encounter: Payer: Self-pay | Admitting: Family Medicine

## 2021-02-28 ENCOUNTER — Ambulatory Visit (INDEPENDENT_AMBULATORY_CARE_PROVIDER_SITE_OTHER): Payer: 59 | Admitting: Family Medicine

## 2021-02-28 ENCOUNTER — Other Ambulatory Visit: Payer: Self-pay

## 2021-02-28 VITALS — BP 108/67 | HR 78 | Temp 97.6°F | Resp 20 | Ht 64.0 in | Wt 112.0 lb

## 2021-02-28 DIAGNOSIS — Z0001 Encounter for general adult medical examination with abnormal findings: Secondary | ICD-10-CM

## 2021-02-28 DIAGNOSIS — Z0000 Encounter for general adult medical examination without abnormal findings: Secondary | ICD-10-CM

## 2021-02-28 DIAGNOSIS — F411 Generalized anxiety disorder: Secondary | ICD-10-CM | POA: Diagnosis not present

## 2021-02-28 DIAGNOSIS — R5383 Other fatigue: Secondary | ICD-10-CM | POA: Diagnosis not present

## 2021-02-28 DIAGNOSIS — J4540 Moderate persistent asthma, uncomplicated: Secondary | ICD-10-CM

## 2021-02-28 DIAGNOSIS — Z23 Encounter for immunization: Secondary | ICD-10-CM

## 2021-02-28 NOTE — Progress Notes (Signed)
Assessment & Plan:  1. Well adult exam Preventive health education provided. Patient declined HPV and PNA vaccines, as well as HIV and hepatitis C screening. - Anemia Profile B - CMP14+EGFR - TSH - VITAMIN D 25 Hydroxy (Vit-D Deficiency, Fractures)  2. Moderate persistent asthma without complication Well controlled on current regimen.   3. Generalized anxiety disorder Improving. - CMP14+EGFR  4. Fatigue, unspecified type Looking for a cause of fatigue in lab work. Discussed if this was normal and patient would like, we can try changing Zoloft to something else to see if it resolves. - Anemia Profile B - TSH - VITAMIN D 25 Hydroxy (Vit-D Deficiency, Fractures)  5. Immunization due - Tdap vaccine greater than or equal to 7yo IM - given in office.   Follow-up: Return in about 1 year (around 02/28/2022) for annual physical.   Hendricks Limes, MSN, APRN, FNP-C [name]  Subjective:  Patient ID: Colleen Mason, female    DOB: September 30, 2000  Age: 20 y.o. MRN: [id number]  Patient Care Team: Loman Brooklyn, FNP as PCP - General (Family Medicine)   CC:  Chief Complaint  Patient presents with   Annual Exam    HPI Colleen Mason presents for her annual physical.  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  She reports it is helping a little bit more.  She is concerned that she feels like she could sleep all day if she would let herself.  She does sleep well at night. It is not worse with dose increase, but she feels it may be medication  related.  Depression screen [facility] 2/9 02/28/2021 01/15/2021 01/03/2021  Decreased Interest 0 1 0  Down, Depressed, Hopeless 1 0 0  PHQ - 2 Score 1 1 0  Altered sleeping 1 1 0  Tired, decreased energy 2 0 0  Change in appetite 0 0 0  Feeling bad or failure about yourself  0 0 0  Trouble concentrating 0 0 0  Moving slowly or fidgety/restless 0 0 0  Suicidal thoughts 0 0 0  PHQ-9 Score 4 2 0  Difficult doing work/chores Not difficult at all - -   GAD 7 : Generalized Anxiety Score 02/28/2021 01/15/2021 07/19/2019  Nervous, Anxious, on Edge 1 1 0  Control/stop worrying 1 0 0  Worry too much - different things 1 1 0  Trouble relaxing 0 0 0  Restless 0 0 0  Easily annoyed or irritable 0 0 0  Afraid - awful might happen 1 0 0  Total GAD 7 Score 4 2 0  Anxiety Difficulty Not difficult at all Not difficult at all -    Review of Systems  Constitutional:  Negative for chills, fever, malaise/fatigue and weight loss.  HENT:  Negative for congestion, ear discharge, ear pain, nosebleeds, sinus pain, sore throat and tinnitus.   Eyes:  Negative for blurred vision, double vision, pain, discharge and redness.  Respiratory:  Negative for cough, shortness of breath and wheezing.   Cardiovascular:  Negative for chest pain, palpitations and leg swelling.  Gastrointestinal:  Negative for abdominal  pain, constipation, diarrhea, heartburn, nausea and vomiting.  Genitourinary:  Negative for dysuria, frequency and urgency.  Musculoskeletal:  Negative for myalgias.  Skin:  Negative for rash.  Neurological:  Negative for dizziness, seizures, weakness and headaches.  Psychiatric/Behavioral:  Negative for depression, substance abuse and suicidal ideas. The patient is not nervous/anxious.     Current Outpatient Medications:    albuterol (VENTOLIN HFA) 108 (90 Base) MCG/ACT inhaler, Inhale 2 puffs into the lungs every 6 (six) hours as needed for wheezing or shortness of breath., Disp: 54 g, Rfl: 3   Fluticasone  Propionate, Inhal, (FLOVENT DISKUS) 100 MCG/BLIST AEPB, Inhale 1 Inhaler into the lungs 2 (two) times daily., Disp: 180 each, Rfl: 2   norethindrone-ethinyl estradiol (LOESTRIN 1/20, 21,) 1-20 MG-MCG tablet, Take 1 tablet by mouth daily., Disp: 28 tablet, Rfl: 11   sertraline (ZOLOFT) 100 MG tablet, Take 1 tablet (100 mg total) by mouth daily., Disp: 30 tablet, Rfl: 3  Allergies  Allergen Reactions   Penicillins Hives    Past Medical History:  Diagnosis Date   Allergy    Anxiety    Asthma    Auditory processing disorder    Hyperacusis    Memory difficulty    tolerance fading memory per mother   Raynaud disease     History reviewed. No pertinent surgical history.  Family History  Problem Relation Age of Onset   Raynaud syndrome Mother    Asthma Mother    Arthritis Mother        Psoriatic or RA   Asthma Father    Raynaud syndrome Sister    Vitiligo Brother    Arthritis Maternal Grandmother    Thyroid disease Maternal Grandfather    Rheum arthritis Maternal Grandfather    Heart attack Maternal Grandfather    Heart disease Maternal Grandfather    Pancreatic cancer Paternal Grandmother    Diabetes Paternal Grandmother    Prostate cancer Paternal Grandfather    Diabetes Paternal Grandfather    Breast cancer Other    Diabetes Other    Cancer Other        Unknown type   Diabetes Other    Diabetes Maternal Aunt     Social History   Socioeconomic History   Marital status: Single    Spouse name: Not on file   Number of children: Not on file   Years of education: Not on file   Highest education level: Not on file  Occupational History   Not on file  Tobacco Use   Smoking status: Never   Smokeless tobacco: Never  Vaping Use   Vaping Use: Never used  Substance and Sexual Activity   Alcohol use: Never   Drug use: Never   Sexual activity: Not on file  Other Topics Concern   Not on file  Social History Narrative   Not on file   Social Determinants of Health    Financial Resource Strain: Not on file  Food Insecurity: Not on file  Transportation Needs: Not on file  Physical Activity: Not on file  Stress: Not on file  Social Connections: Not on file  Intimate Partner Violence: Not on file      Objective:    BP 108/67   Pulse 78   Temp 97.6 F (36.4 C) (Temporal)   Resp 20   Ht 5' 4"  (1.626 m)   Wt 112 lb (50.8 kg)   SpO2 100%   BMI 19.22 kg/m   Wt Readings from Last 3 Encounters:  02/28/21 112 lb (50.8 kg)  02/11/21 112 lb (50.8 kg)  01/15/21 113 lb 6.4 oz (51.4 kg) (21 %, Z= -0.81)*   * Growth percentiles are based on CDC (Girls, 2-20 Years) data.    Physical Exam Vitals reviewed. Exam conducted with a chaperone present.  Constitutional:      General: She is not in acute distress.    Appearance: Normal appearance. She is not ill-appearing, toxic-appearing or diaphoretic.  HENT:     Head: Normocephalic and atraumatic.     Right Ear: Tympanic membrane, ear canal and external ear normal. There is no impacted cerumen.     Left Ear: Tympanic membrane, ear canal and external ear normal. There is no impacted cerumen.     Nose: Nose normal. No congestion or rhinorrhea.     Mouth/Throat:     Mouth: Mucous membranes are moist.     Pharynx: Oropharynx is clear. No oropharyngeal exudate or posterior oropharyngeal erythema.  Eyes:     General: No scleral icterus.       Right eye: No discharge.        Left eye: No discharge.     Conjunctiva/sclera: Conjunctivae normal.     Pupils: Pupils are equal, round, and reactive to light.  Cardiovascular:     Rate and Rhythm: Normal rate and regular rhythm.     Heart sounds: Normal heart sounds. No murmur heard.   No friction rub. No gallop.  Pulmonary:     Effort: Pulmonary effort is normal. No respiratory distress.     Breath sounds: Normal breath sounds. No stridor. No wheezing, rhonchi or rales.  Chest:  Breasts:    Breasts are symmetrical.     Right: Normal. No axillary adenopathy  or supraclavicular adenopathy.     Left: Normal. No axillary adenopathy or supraclavicular adenopathy.  Abdominal:     General: Abdomen is flat. Bowel sounds are normal. There is no distension.     Palpations: Abdomen is soft. There is no hepatomegaly, splenomegaly or mass.     Tenderness: There is no abdominal tenderness. There is no guarding or rebound.     Hernia: No hernia is present.  Musculoskeletal:        General: Normal range of motion.     Cervical back: Normal range of motion and neck supple. No rigidity. No muscular tenderness.  Lymphadenopathy:     Cervical: No cervical adenopathy.     Upper Body:     Right upper body: No supraclavicular, axillary or pectoral adenopathy.     Left upper body: No supraclavicular, axillary or pectoral adenopathy.  Skin:    General: Skin is warm and dry.     Capillary Refill: Capillary refill takes less than 2 seconds.  Neurological:     General: No focal deficit present.     Mental Status: She is alert and oriented to person, place, and time. Mental status is at baseline.  Psychiatric:        Mood and Affect: Mood normal.        Behavior: Behavior normal.        Thought Content: Thought content normal.        Judgment: Judgment normal.    No results found for: TSH No results found for: WBC, HGB, HCT, MCV, PLT No results found for: NA, K, CHLORIDE, CO2, GLUCOSE, BUN, CREATININE, BILITOT, ALKPHOS, AST, ALT, PROT, ALBUMIN, CALCIUM, ANIONGAP, EGFR, GFR No results found for: CHOL No results found for: HDL No results found for: [facility]  No results found for: TRIG No results found for: CHOLHDL No results found for: HGBA1C

## 2021-02-28 NOTE — Patient Instructions (Signed)
Preventive Care 18-21 Years Old, Female Preventive care refers to lifestyle choices and visits with your health care provider that can promote health and wellness. At this stage in your life, you may start seeing a primary care physician instead of a pediatrician. It is important to take responsibility for your health and well-being. Preventive care for young adults includes: A yearly physical exam. This is also called an annual wellness visit. Regular dental and eye exams. Immunizations. Screening for certain conditions. Healthy lifestyle choices, such as: Eating a healthy diet. Getting regular exercise. Not using drugs or products that contain nicotine and tobacco. Limiting alcohol use. What can I expect for my preventive care visit? Physical exam Your health care provider may check your: Height and weight. These may be used to calculate your BMI (body mass index). BMI is a measurement that tells if you are at a healthy weight. Heart rate and blood pressure. Body temperature. Skin for abnormal spots. Counseling Your health care provider may ask you questions about your: Past medical problems. Family's medical history. Alcohol, tobacco, and drug use. Home life and relationship well-being. Access to firearms. Emotional well-being. Diet, exercise, and sleep habits. Sexual activity and sexual health. Method of birth control. Menstrual cycle. Pregnancy history. What immunizations do I need? Vaccines are usually given at various ages, according to a schedule. Your health care provider will recommend vaccines for you based on your age, medicalhistory, and lifestyle or other factors, such as travel or where you work. What tests do I need? Blood tests Lipid and cholesterol levels. These may be checked every 5 years starting at age 20. Hepatitis C test. Hepatitis B test. Screening Pelvic exam and Pap test. This may be done every 3 years starting at age 21. STD (sexually transmitted  disease) testing, if you are at risk. BRCA-related cancer screening. This may be done if you have a family history of breast, ovarian, tubal, or peritoneal cancers. Other tests Tuberculosis skin test. Vision and hearing tests. Skin exam. Breast exam. Talk with your health care provider about your test results, treatment options,and if necessary, the need for more tests. Follow these instructions at home: Eating and drinking Eat a healthy diet that includes fresh fruits and vegetables, whole grains, lean protein, and low-fat dairy products. Drink enough fluid to keep your urine pale yellow. Do not drink alcohol if: Your health care provider tells you not to drink. You are pregnant, may be pregnant, or are planning to become pregnant. You are under the legal drinking age. In the [place] the legal drinking age is 21. If you drink alcohol: Limit how much you use to 0-1 drink a day. Be aware of how much alcohol is in your drink. In the [place] one drink equals one 12 oz bottle of beer (355 mL), one 5 oz glass of wine (148 mL), or one 1 oz glass of hard liquor (44 mL).  Lifestyle Take daily care of your teeth and gums. Brush your teeth every morning and night with fluoride toothpaste. Floss one time each day. Stay active. Exercise for at least 30 minutes 5 or more days of the week. Do not use any products that contain nicotine or tobacco, such as cigarettes, e-cigarettes, and chewing tobacco. If you need help quitting, ask your health care provider. Do not use drugs. If you are sexually active, practice safe sex. Use a condom or other form of protection to prevent STIs (sexually transmitted infections). If you do not wish to become pregnant, use a form   of birth control. If you plan to become pregnant, see your health care provider for a prepregnancy visit. Find healthy ways to cope with stress, such as: Meditation, yoga, or listening to music. Journaling. Talking to a trusted person. Spending  time with friends and family. Safety Always wear your seat belt while driving or riding in a vehicle. Do not drive: If you have been drinking alcohol. Do not ride with someone who has been drinking. When you are tired or distracted. While texting. Wear a helmet and other protective equipment during sports activities. If you have firearms in your house, make sure you follow all gun safety procedures. Seek help if you have been bullied, physically abused, or sexually abused. Use the Internet responsibly to avoid dangers, such as online bullying and online sex predators. What's next? Go to your health care provider once a year for an annual wellness visit. Ask your health care provider how often you should have your eyes and teeth checked. Stay up to date on all vaccines. This information is not intended to replace advice given to you by your health care provider. Make sure you discuss any questions you have with your healthcare provider. Document Revised: 04/21/2020 Document Reviewed: 08/18/2018 Elsevier Patient Education  2022 Elsevier Inc.  

## 2021-03-06 ENCOUNTER — Other Ambulatory Visit: Payer: 59

## 2021-03-07 LAB — CMP14+EGFR
ALT: 11 IU/L (ref 0–32)
AST: 15 IU/L (ref 0–40)
Albumin/Globulin Ratio: 1.9 (ref 1.2–2.2)
Albumin: 4.7 g/dL (ref 3.9–5.0)
Alkaline Phosphatase: 41 IU/L — ABNORMAL LOW (ref 42–106)
BUN/Creatinine Ratio: 11 (ref 9–23)
BUN: 10 mg/dL (ref 6–20)
Bilirubin Total: 0.3 mg/dL (ref 0.0–1.2)
CO2: 24 mmol/L (ref 20–29)
Calcium: 9.5 mg/dL (ref 8.7–10.2)
Chloride: 100 mmol/L (ref 96–106)
Creatinine, Ser: 0.93 mg/dL (ref 0.57–1.00)
Globulin, Total: 2.5 g/dL (ref 1.5–4.5)
Glucose: 78 mg/dL (ref 65–99)
Potassium: 4.5 mmol/L (ref 3.5–5.2)
Sodium: 138 mmol/L (ref 134–144)
Total Protein: 7.2 g/dL (ref 6.0–8.5)
eGFR: 90 mL/min/{1.73_m2} (ref >59)

## 2021-03-07 LAB — VITAMIN D 25 HYDROXY (VIT D DEFICIENCY, FRACTURES): Vit D, 25-Hydroxy: 34.5 ng/mL (ref 30.0–100.0)

## 2021-03-07 LAB — ANEMIA PROFILE B
Basophils Absolute: 0 10*3/uL (ref 0.0–0.2)
Basos: 1 %
EOS (ABSOLUTE): 0.1 10*3/uL (ref 0.0–0.4)
Eos: 3 %
Ferritin: 19 ng/mL (ref 15–150)
Folate: 18.3 ng/mL (ref >3.0)
Hematocrit: 43.6 % (ref 34.0–46.6)
Hemoglobin: 14.4 g/dL (ref 11.1–15.9)
Immature Grans (Abs): 0 10*3/uL (ref 0.0–0.1)
Immature Granulocytes: 0 %
Iron Saturation: 26 % (ref 15–55)
Iron: 108 ug/dL (ref 27–159)
Lymphocytes Absolute: 1.7 10*3/uL (ref 0.7–3.1)
Lymphs: 45 %
MCH: 30.3 pg (ref 26.6–33.0)
MCHC: 33 g/dL (ref 31.5–35.7)
MCV: 92 fL (ref 79–97)
Monocytes Absolute: 0.3 10*3/uL (ref 0.1–0.9)
Monocytes: 9 %
Neutrophils Absolute: 1.6 10*3/uL (ref 1.4–7.0)
Neutrophils: 42 %
Platelets: 356 10*3/uL (ref 150–450)
RBC: 4.76 x10E6/uL (ref 3.77–5.28)
RDW: 12.3 % (ref 11.7–15.4)
Retic Ct Pct: 1.3 % (ref 0.6–2.6)
Total Iron Binding Capacity: 409 ug/dL (ref 250–450)
UIBC: 301 ug/dL (ref 131–425)
Vitamin B-12: 270 pg/mL (ref 232–1245)
WBC: 3.7 10*3/uL (ref 3.4–10.8)

## 2021-03-07 LAB — TSH: TSH: 2.59 u[IU]/mL (ref 0.450–4.500)

## 2021-03-19 ENCOUNTER — Ambulatory Visit: Payer: 59 | Admitting: Family Medicine

## 2021-03-19 ENCOUNTER — Other Ambulatory Visit: Payer: Self-pay

## 2021-03-19 ENCOUNTER — Telehealth: Payer: Self-pay | Admitting: Family Medicine

## 2021-03-19 ENCOUNTER — Encounter: Payer: Self-pay | Admitting: Family Medicine

## 2021-03-19 VITALS — BP 115/68 | HR 82 | Temp 98.1°F | Ht 64.0 in | Wt 113.2 lb

## 2021-03-19 DIAGNOSIS — F411 Generalized anxiety disorder: Secondary | ICD-10-CM | POA: Diagnosis not present

## 2021-03-19 MED ORDER — FLUOXETINE HCL 20 MG PO CAPS: 20.0000 mg | ORAL_CAPSULE | Freq: Every day | ORAL | 2 refills | Status: DC

## 2021-03-19 MED ORDER — SERTRALINE HCL 50 MG PO TABS: ORAL_TABLET | ORAL | 0 refills | Status: DC

## 2021-03-19 NOTE — Progress Notes (Signed)
Assessment & Plan:  1. Generalized anxiety disorder Patient to decrease sertraline from 100 mg to 50 mg once daily x2 weeks, then decrease to 25 mg x 2 weeks, then stop.  She is to go ahead and start taking Prozac 20 mg now. - FLUoxetine (PROZAC) 20 MG capsule; Take 1 capsule (20 mg total) by mouth daily.  Dispense: 30 capsule; Refill: 2 - sertraline (ZOLOFT) 50 MG tablet; Take 1 tablet (50 mg total) by mouth daily for 14 days, THEN 0.5 tablets (25 mg total) daily for 14 days.  Dispense: 21 tablet; Refill: 0   Return in about 6 weeks (around 04/30/2021) for Anxiety.  Hendricks Limes, MSN, APRN, FNP-C [facility]  Subjective:    Patient ID: Colleen Mason, female    DOB: 31-Oct-2000, 20 y.o.   MRN: [id number]  Patient Care Team: Loman Brooklyn, FNP as PCP - General (Family Medicine)   Chief Complaint:  Chief Complaint  Patient presents with   Anxiety    Patient would like to change her medication due to it making her sleep more.     HPI: Colleen Mason is a 20 y.o. female presenting on 03/19/2021 for Anxiety (Patient would like to change her medication due to it making her sleep more. )  Patient is here to switch off of Zoloft due to symptoms of fatigue since she has been taking.  She has been on Zoloft for a couple of years now.  She has never taken anything else for anxiety.  Depression screen [facility] 2/9 03/19/2021 02/28/2021 01/15/2021  Decreased Interest 0 0 1  Down, Depressed, Hopeless 1 1 0  PHQ - 2 Score 1 1 1   Altered sleeping 2 1 1   Tired, decreased energy 2 2 0  Change in appetite 0 0 0  Feeling bad or failure about yourself  0 0 0  Trouble concentrating 0 0 0  Moving slowly or fidgety/restless 0 0 0  Suicidal thoughts 0 0 0  PHQ-9 Score 5 4 2   Difficult doing work/chores Not difficult at all Not difficult at all -   GAD 7 : Generalized Anxiety Score 03/19/2021 02/28/2021 01/15/2021 07/19/2019  Nervous, Anxious, on Edge 1 1 1  0  Control/stop worrying 1 1 0  0  Worry too much - different things 1 1 1  0  Trouble relaxing 0 0 0 0  Restless 0 0 0 0  Easily annoyed or irritable 0 0 0 0  Afraid - awful might happen 1 1 0 0  Total GAD 7 Score 4 4 2  0  Anxiety Difficulty Not difficult at all Not difficult at all Not difficult at all -    New complaints: None  Social history:  Relevant past medical, surgical, family and social history reviewed and updated as indicated. Interim medical history since our last visit reviewed.  Allergies and medications reviewed and updated.  DATA REVIEWED: CHART IN EPIC  ROS: Negative unless specifically indicated above in HPI.    Current Outpatient Medications:    albuterol (VENTOLIN HFA) 108 (90 Base) MCG/ACT inhaler, Inhale 2 puffs into the lungs every 6 (six) hours as needed for wheezing or shortness of breath., Disp: 54 g, Rfl: 3   Fluticasone Propionate, Inhal, (FLOVENT DISKUS) 100 MCG/BLIST AEPB, Inhale 1 Inhaler into the lungs 2 (two) times daily., Disp: 180 each, Rfl: 2   norethindrone-ethinyl estradiol (LOESTRIN 1/20, 21,) 1-20 MG-MCG tablet, Take 1 tablet by mouth daily., Disp: 28 tablet, Rfl: 11   sertraline (ZOLOFT) 100  MG tablet, Take 1 tablet (100 mg total) by mouth daily., Disp: 30 tablet, Rfl: 3   Allergies  Allergen Reactions   Penicillins Hives   Past Medical History:  Diagnosis Date   Allergy    Anxiety    Asthma    Auditory processing disorder    Hyperacusis    Memory difficulty    tolerance fading memory per mother   Raynaud disease     History reviewed. No pertinent surgical history.  Social History   Socioeconomic History   Marital status: Single    Spouse name: Not on file   Number of children: Not on file   Years of education: Not on file   Highest education level: Not on file  Occupational History   Not on file  Tobacco Use   Smoking status: Never   Smokeless tobacco: Never  Vaping Use   Vaping Use: Never used  Substance and Sexual Activity   Alcohol use:  Never   Drug use: Never   Sexual activity: Not on file  Other Topics Concern   Not on file  Social History Narrative   Not on file   Social Determinants of Health   Financial Resource Strain: Not on file  Food Insecurity: Not on file  Transportation Needs: Not on file  Physical Activity: Not on file  Stress: Not on file  Social Connections: Not on file  Intimate Partner Violence: Not on file        Objective:    BP 115/68   Pulse 82   Temp 98.1 F (36.7 C) (Temporal)   Ht 5' 4"  (1.626 m)   Wt 113 lb 3.2 oz (51.3 kg)   BMI 19.43 kg/m   Wt Readings from Last 3 Encounters:  03/19/21 113 lb 3.2 oz (51.3 kg)  02/28/21 112 lb (50.8 kg)  02/11/21 112 lb (50.8 kg)    Physical Exam Vitals reviewed.  Constitutional:      General: She is not in acute distress.    Appearance: Normal appearance. She is underweight. She is not ill-appearing, toxic-appearing or diaphoretic.  HENT:     Head: Normocephalic and atraumatic.  Eyes:     General: No scleral icterus.       Right eye: No discharge.        Left eye: No discharge.     Conjunctiva/sclera: Conjunctivae normal.  Cardiovascular:     Rate and Rhythm: Normal rate.  Pulmonary:     Effort: Pulmonary effort is normal. No respiratory distress.  Musculoskeletal:        General: Normal range of motion.     Cervical back: Normal range of motion.  Skin:    General: Skin is warm and dry.     Capillary Refill: Capillary refill takes less than 2 seconds.  Neurological:     General: No focal deficit present.     Mental Status: She is alert and oriented to person, place, and time. Mental status is at baseline.  Psychiatric:        Mood and Affect: Mood normal.        Behavior: Behavior normal.        Thought Content: Thought content normal.        Judgment: Judgment normal.    Lab Results  Component Value Date   TSH 2.590 03/06/2021   Lab Results  Component Value Date   WBC 3.7 03/06/2021   HGB 14.4 03/06/2021   HCT  43.6 03/06/2021   MCV 92 03/06/2021  PLT 356 03/06/2021   Lab Results  Component Value Date   NA 138 03/06/2021   K 4.5 03/06/2021   CO2 24 03/06/2021   GLUCOSE 78 03/06/2021   BUN 10 03/06/2021   CREATININE 0.93 03/06/2021   BILITOT 0.3 03/06/2021   ALKPHOS 41 (L) 03/06/2021   AST 15 03/06/2021   ALT 11 03/06/2021   PROT 7.2 03/06/2021   ALBUMIN 4.7 03/06/2021   CALCIUM 9.5 03/06/2021   EGFR 90 03/06/2021   No results found for: CHOL No results found for: HDL No results found for: LDLCALC No results found for: TRIG No results found for: CHOLHDL No results found for: HGBA1C

## 2021-03-19 NOTE — Telephone Encounter (Signed)
She can take it either way.

## 2021-03-20 NOTE — Telephone Encounter (Signed)
Lmtcb.

## 2021-03-20 NOTE — Telephone Encounter (Signed)
Patient aware and verbalizes understanding. 

## 2021-04-28 ENCOUNTER — Ambulatory Visit: Payer: 59 | Admitting: Family Medicine

## 2021-04-30 ENCOUNTER — Ambulatory Visit: Payer: 59 | Admitting: Family Medicine

## 2021-05-08 ENCOUNTER — Encounter: Payer: Self-pay | Admitting: Family Medicine

## 2021-05-08 ENCOUNTER — Ambulatory Visit: Payer: 59 | Admitting: Family Medicine

## 2021-05-08 ENCOUNTER — Other Ambulatory Visit: Payer: Self-pay

## 2021-05-08 VITALS — BP 107/69 | HR 82 | Temp 98.1°F | Ht 64.0 in | Wt 112.4 lb

## 2021-05-08 DIAGNOSIS — F411 Generalized anxiety disorder: Secondary | ICD-10-CM | POA: Diagnosis not present

## 2021-05-08 MED ORDER — ESCITALOPRAM OXALATE 10 MG PO TABS: 10.0000 mg | ORAL_TABLET | Freq: Every day | ORAL | 2 refills | Status: DC

## 2021-05-08 NOTE — Progress Notes (Signed)
Assessment & Plan:  1. Generalized anxiety disorder - uncontrolled, has previously been on Zoloft and attempted to transition to Prozac, failed Prozac and resumed 60m Zoloft  - will transition from 541mZoloft to 1058mexapro - escitalopram (LEXAPRO) 10 MG tablet; Take 1 tablet (10 mg total) by mouth daily.  Dispense: 30 tablet; Refill: 2 - we offered to do genesight at her next appointment if she fails Lexapro   Return in about 6 weeks (around 06/19/2021) for Anxiety.  AshLucile CraterP Student  I personally was present during the history, physical exam, and medical decision-making activities of this service and have verified that the service and findings are accurately documented in the nurse practitioner student's note.  BriHendricks LimesSN, APRN, FNP-C [facility]   Subjective:    Patient ID: [name]    DOB: [birth year] 58o.   MRN: [id number] Care Team: JoyLoman BrooklynNP as PCP - General (Family Medicine)   Chief Complaint:  Chief Complaint  Patient presents with   Anxiety    6 week follow up. Patient states that the prozac made her feel worse and no longer is taking it.     HPI: [name] a [age]o. female presenting on 05/08/2021 for Anxiety (6 week follow up. Patient states that the prozac made her feel worse and no longer is taking it. )  Patient was seen 6 weeks ago and was started on Prozac from Zoloft. She states that she did not like how it makes her feel, it made it more sleepy than the Zoloft and made her forgetful. When she stopped taking the Prozac she kept taking the 50 mg dose of Zoloft.  [facility] 05/08[facility]2021 03/19/2021 02/28/2021  Decreased Interest 0 0 0  Down, Depressed, Hopeless 0 1 1  PHQ - 2 Score 0 1 1  Altered sleeping 2 2 1   Tired, decreased energy 2 2 2   Change in appetite 0 0 0  Feeling bad or failure about yourself  0 0 0  Trouble concentrating 0 0 0  Moving slowly or  fidgety/restless 0 0 0  Suicidal thoughts - 0 0  PHQ-9 Score 4 5 4   Difficult doing work/chores Not difficult at all Not difficult at all Not difficult at all   GAD 7 : Generalized Anxiety Score 05/08/2021 03/19/2021 02/28/2021 01/15/2021  Nervous, Anxious, on Edge 1 1 1 1   Control/stop worrying 1 1 1  0  Worry too much - different things 1 1 1 1   Trouble relaxing 0 0 0 0  Restless 0 0 0 0  Easily annoyed or irritable 0 0 0 0  Afraid - awful might happen 1 1 1  0  Total GAD 7 Score 4 4 4 2   Anxiety Difficulty Not difficult at all Not difficult at all Not difficult at all Not difficult at all    New complaints: None  Social history:  Relevant past medical, surgical, family and social history reviewed and updated as indicated. Interim medical history since our last visit reviewed.  Allergies and medications reviewed and updated.  DATA REVIEWED: CHART IN EPIC  ROS: Negative unless specifically indicated above in HPI.    Current Outpatient Medications:    albuterol (VENTOLIN HFA) 108 (90 Base) MCG/ACT inhaler, Inhale 2 puffs into the lungs every 6 (six) hours as needed for wheezing or shortness of breath., Disp: 54 g, Rfl: 3   escitalopram (LEXAPRO) 10 MG tablet, Take 1 tablet (  10 mg total) by mouth daily., Disp: 30 tablet, Rfl: 2   Fluticasone Propionate, Inhal, (FLOVENT DISKUS) 100 MCG/BLIST AEPB, Inhale 1 Inhaler into the lungs 2 (two) times daily., Disp: 180 each, Rfl: 2   norethindrone-ethinyl estradiol (LOESTRIN 1/20, 21,) 1-20 MG-MCG tablet, Take 1 tablet by mouth daily., Disp: 28 tablet, Rfl: 11   sertraline (ZOLOFT) 50 MG tablet, Take 1 tablet (50 mg total) by mouth daily for 14 days, THEN 0.5 tablets (25 mg total) daily for 14 days., Disp: 21 tablet, Rfl: 0   Allergies  Allergen Reactions   Penicillins Hives   Past Medical History:  Diagnosis Date   Allergy    Anxiety    Asthma    Auditory processing disorder    Hyperacusis    Memory difficulty    tolerance fading  memory per mother   Raynaud disease     History reviewed. No pertinent surgical history.  Social History   Socioeconomic History   Marital status: Single    Spouse name: Not on file   Number of children: Not on file   Years of education: Not on file   Highest education level: Not on file  Occupational History   Not on file  Tobacco Use   Smoking status: Never   Smokeless tobacco: Never  Vaping Use   Vaping Use: Never used  Substance and Sexual Activity   Alcohol use: Never   Drug use: Never   Sexual activity: Not on file  Other Topics Concern   Not on file  Social History Narrative   Not on file   Social Determinants of Health   Financial Resource Strain: Not on file  Food Insecurity: Not on file  Transportation Needs: Not on file  Physical Activity: Not on file  Stress: Not on file  Social Connections: Not on file  Intimate Partner Violence: Not on file        Objective:    BP 107/69   Pulse 82   Temp 98.1 F (36.7 C) (Temporal)   Ht 5' 4"  (1.626 m)   Wt 51 kg   SpO2 100%   BMI 19.29 kg/m   Wt Readings from Last 3 Encounters:  05/08/21 112 lb 6.4 oz (51 kg)  03/19/21 113 lb 3.2 oz (51.3 kg)  02/28/21 112 lb (50.8 kg)    Physical Exam Vitals reviewed.  Constitutional:      General: She is not in acute distress.    Appearance: Normal appearance. She is underweight. She is not ill-appearing, toxic-appearing or diaphoretic.  HENT:     Head: Normocephalic and atraumatic.  Eyes:     General: No scleral icterus.       Right eye: No discharge.        Left eye: No discharge.     Conjunctiva/sclera: Conjunctivae normal.  Cardiovascular:     Rate and Rhythm: Normal rate.  Pulmonary:     Effort: Pulmonary effort is normal. No respiratory distress.  Musculoskeletal:        General: Normal range of motion.     Cervical back: Normal range of motion.  Skin:    General: Skin is warm and dry.     Capillary Refill: Capillary refill takes less than 2  seconds.  Neurological:     General: No focal deficit present.     Mental Status: She is alert and oriented to person, place, and time. Mental status is at baseline.  Psychiatric:        Mood and  Affect: Mood normal.        Behavior: Behavior normal.        Thought Content: Thought content normal.        Judgment: Judgment normal.    Lab Results  Component Value Date   TSH 2.590 03/06/2021   Lab Results  Component Value Date   WBC 3.7 03/06/2021   HGB 14.4 03/06/2021   HCT 43.6 03/06/2021   MCV 92 03/06/2021   PLT 356 03/06/2021   Lab Results  Component Value Date   NA 138 03/06/2021   K 4.5 03/06/2021   CO2 24 03/06/2021   GLUCOSE 78 03/06/2021   BUN 10 03/06/2021   CREATININE 0.93 03/06/2021   BILITOT 0.3 03/06/2021   ALKPHOS 41 (L) 03/06/2021   AST 15 03/06/2021   ALT 11 03/06/2021   PROT 7.2 03/06/2021   ALBUMIN 4.7 03/06/2021   CALCIUM 9.5 03/06/2021   EGFR 90 03/06/2021   No results found for: CHOL No results found for: HDL No results found for: LDLCALC No results found for: TRIG No results found for: CHOLHDL No results found for: HGBA1C

## 2021-06-06 ENCOUNTER — Other Ambulatory Visit: Payer: Self-pay

## 2021-06-06 ENCOUNTER — Other Ambulatory Visit (HOSPITAL_COMMUNITY)
Admission: RE | Admit: 2021-06-06 | Discharge: 2021-06-06 | Disposition: A | Discharge status: Home / Self Care | Payer: 59 | Source: Ambulatory Visit | Attending: Nurse Practitioner | Admitting: Nurse Practitioner

## 2021-06-06 ENCOUNTER — Ambulatory Visit: Payer: 59 | Admitting: Nurse Practitioner

## 2021-06-06 ENCOUNTER — Encounter: Payer: Self-pay | Admitting: Nurse Practitioner

## 2021-06-06 VITALS — BP 115/72 | HR 85 | Temp 97.7°F | Ht 64.0 in | Wt 114.4 lb

## 2021-06-06 DIAGNOSIS — Z202 Contact with and (suspected) exposure to infections with a predominantly sexual mode of transmission: Secondary | ICD-10-CM | POA: Diagnosis not present

## 2021-06-06 NOTE — Progress Notes (Signed)
Acute Office Visit  Subjective:    Patient ID: Colleen Mason, female    DOB: 02-13-2001, 20 y.o.   MRN: [id number]  Chief Complaint  Patient presents with  . std screening    Exposure to STD  The patient's pertinent negatives include no discharge, dyspareunia, dysuria, genital itching or genital rash. This is a new problem. The current episode started 1 to 4 weeks ago. The vaginal discharge was normal. Associate symptoms include abdominal pain. Pertinent negatives include no fever, genital odor or sore throat. She has tried nothing for the symptoms.    Past Medical History:  Diagnosis Date  . Allergy   . Anxiety   . Asthma   . Auditory processing disorder   . Hyperacusis   . Memory difficulty    tolerance fading memory per mother  . Raynaud disease     History reviewed. No pertinent surgical history.  Family History  Problem Relation Age of Onset  . Raynaud syndrome Mother   . Asthma Mother   . Arthritis Mother        Psoriatic or RA  . Asthma Father   . Raynaud syndrome Sister   . Vitiligo Brother   . Arthritis Maternal Grandmother   . Thyroid disease Maternal Grandfather   . Rheum arthritis Maternal Grandfather   . Heart attack Maternal Grandfather   . Heart disease Maternal Grandfather   . Pancreatic cancer Paternal Grandmother   . Diabetes Paternal Grandmother   . Prostate cancer Paternal Grandfather   . Diabetes Paternal Grandfather   . Breast cancer Other   . Diabetes Other   . Cancer Other        Unknown type  . Diabetes Other   . Diabetes Maternal Aunt     Social History   Socioeconomic History  . Marital status: Single    Spouse name: Not on file  . Number of children: Not on file  . Years of education: Not on file  . Highest education level: Not on file  Occupational History  . Not on file  Tobacco Use  . Smoking status: Never  . Smokeless tobacco: Never  Vaping Use  . Vaping Use: Never used  Substance and Sexual Activity  . Alcohol use:  Never  . Drug use: Never  . Sexual activity: Not on file  Other Topics Concern  . Not on file  Social History Narrative  . Not on file   Social Determinants of Health   Financial Resource Strain: Not on file  Food Insecurity: Not on file  Transportation Needs: Not on file  Physical Activity: Not on file  Stress: Not on file  Social Connections: Not on file  Intimate Partner Violence: Not on file    Outpatient Medications Prior to Visit  Medication Sig Dispense Refill  . albuterol (VENTOLIN HFA) 108 (90 Base) MCG/ACT inhaler Inhale 2 puffs into the lungs every 6 (six) hours as needed for wheezing or shortness of breath. 54 g 3  . escitalopram (LEXAPRO) 10 MG tablet Take 1 tablet (10 mg total) by mouth daily. 30 tablet 2  . Fluticasone Propionate, Inhal, (FLOVENT DISKUS) 100 MCG/BLIST AEPB Inhale 1 Inhaler into the lungs 2 (two) times daily. 180 each 2  . norethindrone-ethinyl estradiol (LOESTRIN 1/20, 21,) 1-20 MG-MCG tablet Take 1 tablet by mouth daily. 28 tablet 11  . sertraline (ZOLOFT) 50 MG tablet Take 1 tablet (50 mg total) by mouth daily for 14 days, THEN 0.5 tablets (25 mg total) daily for 14  days. 21 tablet 0   No facility-administered medications prior to visit.    Allergies  Allergen Reactions  . Penicillins Hives    Review of Systems  Constitutional: Negative.  Negative for fever.  HENT: Negative.  Negative for sore throat.   Respiratory: Negative.    Gastrointestinal:  Positive for abdominal pain.  Genitourinary:  Negative for dyspareunia and dysuria.  Skin:  Negative for rash.  All other systems reviewed and are negative.     Objective:    Physical Exam Vitals reviewed.  Constitutional:      Appearance: Normal appearance.  HENT:     Head: Normocephalic.     Nose: Nose normal.  Eyes:     Conjunctiva/sclera: Conjunctivae normal.  Cardiovascular:     Rate and Rhythm: Normal rate and regular rhythm.  Pulmonary:     Effort: Pulmonary effort is  normal.     Breath sounds: Normal breath sounds.  Abdominal:     General: Bowel sounds are normal.     Tenderness: There is abdominal tenderness.  Musculoskeletal:     Cervical back: Normal range of motion.  Neurological:     Mental Status: She is alert and oriented to person, place, and time.    BP 115/72   Pulse 85   Temp 97.7 F (36.5 C) (Temporal)   Ht 5' 4" (1.626 m)   Wt 114 lb 6.4 oz (51.9 kg)   BMI 19.64 kg/m  Wt Readings from Last 3 Encounters:  06/06/21 114 lb 6.4 oz (51.9 kg)  05/08/21 112 lb 6.4 oz (51 kg)  03/19/21 113 lb 3.2 oz (51.3 kg)    Health Maintenance Due  Topic Date Due  . COVID-19 Vaccine (1) Never done    There are no preventive care reminders to display for this patient.   Lab Results  Component Value Date   TSH 2.590 03/06/2021   Lab Results  Component Value Date   WBC 3.7 03/06/2021   HGB 14.4 03/06/2021   HCT 43.6 03/06/2021   MCV 92 03/06/2021   PLT 356 03/06/2021   Lab Results  Component Value Date   NA 138 03/06/2021   K 4.5 03/06/2021   CO2 24 03/06/2021   GLUCOSE 78 03/06/2021   BUN 10 03/06/2021   CREATININE 0.93 03/06/2021   BILITOT 0.3 03/06/2021   ALKPHOS 41 (L) 03/06/2021   AST 15 03/06/2021   ALT 11 03/06/2021   PROT 7.2 03/06/2021   ALBUMIN 4.7 03/06/2021   CALCIUM 9.5 03/06/2021   EGFR 90 03/06/2021   No results found for: CHOL No results found for: HDL No results found for: LDLCALC No results found for: TRIG No results found for: CHOLHDL No results found for: HGBA1C     Assessment & Plan:   Problem List Items Addressed This Visit       Other   Exposure to STD - Primary    Exposure to sexually transmitted disease from unprotected sex.  Patient has only mild lower abdominal pain.  Advised to use Tylenol/ibuprofen for pain.  Completed STI testing results pending.  Education provided to patient printed handouts given.  Patient knows to follow-up with worsening unresolved symptoms.      Relevant  Orders   Urine cytology ancillary only   HIV antibody (with reflex)     No orders of the defined types were placed in this encounter.    Ivy Lynn, NP

## 2021-06-06 NOTE — Patient Instructions (Signed)
Chlamydia, Female Chlamydia is an STI (sexually transmitted infection) that is caused by bacteria. This infection spreads through sexual contact. Chlamydia can occur in different areas of the body, including: The urethra. This is the part of the body that drains urine from the bladder. The cervix. This is the lowest part of the uterus. The throat. The rectum. This condition is not difficult to treat. However, if left untreated, chlamydia can lead to more serious health problems, including pelvic inflammatory disease (PID). PID can increase your risk of being unable to have children. Also, women with untreated chlamydia who are pregnant or become pregnant can spread the infection to their babies during delivery. This may cause serious health problems for their babies. What are the causes? This condition is caused by the bacteria Chlamydia trachomatis. The bacteria are spread from an infected partner during sexual activity. Chlamydia can spread through contact with the genitals, mouth, or rectum. What increases the risk? The following factors may make you more likely to develop this condition: Not using a condom the right way or not using a condom every time you have sex. Having a new sex partner or having more than one sex partner. Being female, age 14-25, and sexually active. What are the signs or symptoms? In some cases, there are no symptoms, especially early in the infection. Having no symptoms is also called being asymptomatic. If symptoms develop, they may include: Urinating often, or a burning feeling during urination. Discharge from the vagina. Redness, soreness, or swelling of the rectum, or bleeding or discharge coming from the rectum. Any of these may occur if the infection was spread through anal sex. Pain in the abdomen. Pain during sex. Bleeding between menstrual periods or irregular periods. Itching, burning, or redness in the eyes, or discharge from the eyes. How is this  diagnosed? This condition may be diagnosed with: Urine tests. Swab tests. Depending on your symptoms, your health care provider may use a cotton swab to collect discharge from your vagina or rectum to test for the bacteria. A pelvic exam. How is this treated? This condition is treated with antibiotic medicines. If you are pregnant, you will need to avoid certain types of antibiotics. Follow these instructions at home: Sexual activity Tell your sex partner or partners about your infection. These include any partners for oral, anal, or vaginal sex that you have had within 60 days of when your symptoms started. Sex partners should also be treated, even if they have no signs of the disease. Do not have sex until you and your sex partners have completed treatment and your health care provider says it is okay. If your health care provider prescribed you a single-dose medicine as treatment, wait at least 7 days after taking the medicine before having sex. General instructions Take over-the-counter and prescription medicines as told by your health care provider. Finish all antibiotic medicine even when you start to feel better. It is up to you to get your test results. Ask your health care provider, or the department that is doing the test, when your results will be ready. Get plenty of rest. Eat a healthy, well-balanced diet. Drink enough fluids to keep your urine pale yellow. Keep all follow-up visits as told by your health care provider. This is important. You may need to be tested for infection again 3 months after treatment. How is this prevented? The only sure way to prevent chlamydia is to avoid having vaginal, anal, and oral sex. However, you can lower your risk   by: Using latex condoms correctly every time you have sex. Not having multiple sex partners. Asking if your sex partner has been tested for STIs and had negative results. Getting regular health screenings to check for STIs. Contact a  health care provider if: You develop new symptoms or your symptoms do not get better after you complete treatment. You have a fever or chills. You have pain during sex. You develop new joint pain or swelling near your joints. You have irregular menstrual periods, or you have bleeding between periods or after sex. You develop flu-like symptoms, such as night sweats, sore throat, or muscle aches. You are pregnant and you develop symptoms of chlamydia. Get help right away if: Your pain gets worse and does not get better with medicine. You have pain in your abdomen or lower back that does not get better with medicine. You feel weak or dizzy, or you faint. Summary Chlamydia is an STI (sexually transmitted infection) that is caused by bacteria. This infection spreads through sexual contact. This condition is not difficult to treat. However, if left untreated, chlamydia can lead to more serious health problems, including pelvic inflammatory disease (PID). Some people have no symptoms (are asymptomatic), especially early in the infection. This condition is treated with antibiotic medicines. Using latex condoms correctly every time you have sex can help prevent chlamydia. This information is not intended to replace advice given to you by your health care provider. Make sure you discuss any questions you have with your health care provider. Document Revised: 08/21/2019 Document Reviewed: 08/21/2019 Elsevier Patient Education  2022 [employer]  

## 2021-06-06 NOTE — Assessment & Plan Note (Signed)
Exposure to sexually transmitted disease from unprotected sex.  Patient has only mild lower abdominal pain.  Advised to use Tylenol/ibuprofen for pain.  Completed STI testing results pending.  Education provided to patient printed handouts given.  Patient knows to follow-up with worsening unresolved symptoms.

## 2021-06-07 LAB — HIV ANTIBODY (ROUTINE TESTING W REFLEX): HIV Screen 4th Generation wRfx: NONREACTIVE

## 2021-06-10 ENCOUNTER — Telehealth: Payer: Self-pay | Admitting: Family Medicine

## 2021-06-10 LAB — URINE CYTOLOGY ANCILLARY ONLY
Chlamydia: NEGATIVE
Comment: NEGATIVE
Comment: NEGATIVE
Comment: NORMAL
Neisseria Gonorrhea: NEGATIVE
Trichomonas: NEGATIVE

## 2021-06-10 NOTE — Telephone Encounter (Signed)
Please review urine

## 2021-06-11 NOTE — Telephone Encounter (Signed)
Patient aware.

## 2021-06-17 ENCOUNTER — Encounter: Payer: Self-pay | Admitting: Family Medicine

## 2021-06-17 ENCOUNTER — Ambulatory Visit: Payer: 59 | Admitting: Family Medicine

## 2021-06-17 ENCOUNTER — Other Ambulatory Visit: Payer: Self-pay

## 2021-06-17 DIAGNOSIS — F411 Generalized anxiety disorder: Secondary | ICD-10-CM

## 2021-06-17 NOTE — Progress Notes (Signed)
Assessment & Plan:  1. Generalized anxiety disorder Well controlled on current regimen.    Return on/after 02/28/2022, for annual physical.  Hendricks Limes, MSN, APRN, FNP-C [name]  Subjective:    Patient ID: Colleen Mason, female    DOB: Dec 01, 2000, 20 y.o.   MRN: [id number]  Patient Care Team: Loman Brooklyn, FNP as PCP - General (Family Medicine)   Chief Complaint:  Chief Complaint  Patient presents with   Anxiety    6 week follow up - patient states that her anxiety is better but she is still tired all the time     HPI: Colleen Mason is a 20 y.o. female presenting on 06/17/2021 for Anxiety (6 week follow up - patient states that her anxiety is better but she is still tired all the time )  Anxiety: patient is following up on starting Lexapro six weeks ago. She states her anxiety is better, but she still feels tired all the time. She is going to school full time and is working part-time. She rests well at night and gets at least 8 hours of sleep. She has failed therapy with Zoloft and Prozac.   GAD 7 : Generalized Anxiety Score 06/17/2021 06/06/2021 05/08/2021 03/19/2021  Nervous, Anxious, on Edge 1 1 1 1   Control/stop worrying 0 1 1 1   Worry too much - different things 1 0 1 1  Trouble relaxing 0 0 0 0  Restless 0 0 0 0  Easily annoyed or irritable 0 0 0 0  Afraid - awful might happen 1 1 1 1   Total GAD 7 Score 3 3 4 4   Anxiety Difficulty Not difficult at all - Not difficult at all Not difficult at all    New complaints: None   Social history:  Relevant past medical, surgical, family and social history reviewed and updated as indicated. Interim medical history since our last visit reviewed.  Allergies and medications reviewed and updated.  DATA REVIEWED: CHART IN EPIC  ROS: Negative unless specifically indicated above in HPI.    Current Outpatient Medications:    albuterol (VENTOLIN HFA) 108 (90 Base) MCG/ACT inhaler, Inhale 2 puffs into  the lungs every 6 (six) hours as needed for wheezing or shortness of breath., Disp: 54 g, Rfl: 3   escitalopram (LEXAPRO) 10 MG tablet, Take 1 tablet (10 mg total) by mouth daily., Disp: 30 tablet, Rfl: 2   Fluticasone Propionate, Inhal, (FLOVENT DISKUS) 100 MCG/BLIST AEPB, Inhale 1 Inhaler into the lungs 2 (two) times daily., Disp: 180 each, Rfl: 2   norethindrone-ethinyl estradiol (LOESTRIN 1/20, 21,) 1-20 MG-MCG tablet, Take 1 tablet by mouth daily., Disp: 28 tablet, Rfl: 11   sertraline (ZOLOFT) 50 MG tablet, Take 1 tablet (50 mg total) by mouth daily for 14 days, THEN 0.5 tablets (25 mg total) daily for 14 days., Disp: 21 tablet, Rfl: 0   Allergies  Allergen Reactions   Penicillins Hives   Past Medical History:  Diagnosis Date   Allergy    Anxiety    Asthma    Auditory processing disorder    Hyperacusis    Memory difficulty    tolerance fading memory per mother   Raynaud disease     History reviewed. No pertinent surgical history.  Social History   Socioeconomic History   Marital status: Single    Spouse name: Not on file   Number of children: Not on file   Years of education: Not on file   Highest education level:  Not on file  Occupational History   Not on file  Tobacco Use   Smoking status: Never   Smokeless tobacco: Never  Vaping Use   Vaping Use: Never used  Substance and Sexual Activity   Alcohol use: Never   Drug use: Never   Sexual activity: Not on file  Other Topics Concern   Not on file  Social History Narrative   Not on file   Social Determinants of Health   Financial Resource Strain: Not on file  Food Insecurity: Not on file  Transportation Needs: Not on file  Physical Activity: Not on file  Stress: Not on file  Social Connections: Not on file  Intimate Partner Violence: Not on file        Objective:    BP 107/67   Pulse 90   Temp 97.8 F (36.6 C) (Temporal)   Ht 5' 4"  (1.626 m)   Wt 117 lb 3.2 oz (53.2 kg)   BMI 20.12 kg/m   Wt  Readings from Last 3 Encounters:  06/17/21 117 lb 3.2 oz (53.2 kg)  06/06/21 114 lb 6.4 oz (51.9 kg)  05/08/21 112 lb 6.4 oz (51 kg)    Physical Exam Vitals reviewed.  Constitutional:      General: She is not in acute distress.    Appearance: Normal appearance. She is normal weight. She is not ill-appearing, toxic-appearing or diaphoretic.  HENT:     Head: Normocephalic and atraumatic.  Eyes:     General: No scleral icterus.       Right eye: No discharge.        Left eye: No discharge.     Conjunctiva/sclera: Conjunctivae normal.  Cardiovascular:     Rate and Rhythm: Normal rate.  Pulmonary:     Effort: Pulmonary effort is normal. No respiratory distress.  Musculoskeletal:        General: Normal range of motion.     Cervical back: Normal range of motion.  Skin:    General: Skin is warm and dry.     Capillary Refill: Capillary refill takes less than 2 seconds.  Neurological:     General: No focal deficit present.     Mental Status: She is alert and oriented to person, place, and time. Mental status is at baseline.  Psychiatric:        Mood and Affect: Mood normal.        Behavior: Behavior normal.        Thought Content: Thought content normal.        Judgment: Judgment normal.    Lab Results  Component Value Date   TSH 2.590 03/06/2021   Lab Results  Component Value Date   WBC 3.7 03/06/2021   HGB 14.4 03/06/2021   HCT 43.6 03/06/2021   MCV 92 03/06/2021   PLT 356 03/06/2021   Lab Results  Component Value Date   NA 138 03/06/2021   K 4.5 03/06/2021   CO2 24 03/06/2021   GLUCOSE 78 03/06/2021   BUN 10 03/06/2021   CREATININE 0.93 03/06/2021   BILITOT 0.3 03/06/2021   ALKPHOS 41 (L) 03/06/2021   AST 15 03/06/2021   ALT 11 03/06/2021   PROT 7.2 03/06/2021   ALBUMIN 4.7 03/06/2021   CALCIUM 9.5 03/06/2021   EGFR 90 03/06/2021   No results found for: CHOL No results found for: HDL No results found for: LDLCALC No results found for: TRIG No results  found for: CHOLHDL No results found for: HGBA1C

## 2021-06-18 ENCOUNTER — Encounter: Payer: Self-pay | Admitting: Family Medicine

## 2021-07-10 ENCOUNTER — Other Ambulatory Visit: Payer: Self-pay | Admitting: *Deleted

## 2021-07-10 DIAGNOSIS — F411 Generalized anxiety disorder: Secondary | ICD-10-CM

## 2021-07-10 MED ORDER — ESCITALOPRAM OXALATE 10 MG PO TABS: 10.0000 mg | ORAL_TABLET | Freq: Every day | ORAL | 0 refills | Status: DC

## 2021-09-30 ENCOUNTER — Other Ambulatory Visit: Payer: Self-pay | Admitting: Family Medicine

## 2021-09-30 DIAGNOSIS — Z30011 Encounter for initial prescription of contraceptive pills: Secondary | ICD-10-CM

## 2021-10-06 ENCOUNTER — Other Ambulatory Visit: Payer: Self-pay | Admitting: Family Medicine

## 2021-10-06 DIAGNOSIS — F411 Generalized anxiety disorder: Secondary | ICD-10-CM

## 2021-10-14 ENCOUNTER — Encounter: Payer: Self-pay | Admitting: Family Medicine

## 2021-11-05 ENCOUNTER — Encounter: Payer: Self-pay | Admitting: Family Medicine

## 2021-11-05 ENCOUNTER — Ambulatory Visit: Payer: 59 | Admitting: Family Medicine

## 2021-11-05 VITALS — BP 113/75 | HR 81 | Temp 97.9°F | Ht 64.0 in | Wt 113.0 lb

## 2021-11-05 DIAGNOSIS — F411 Generalized anxiety disorder: Secondary | ICD-10-CM

## 2021-11-05 MED ORDER — BUSPIRONE HCL 5 MG PO TABS: ORAL_TABLET | ORAL | 0 refills | Status: DC

## 2021-11-05 NOTE — Patient Instructions (Signed)
1/2 tablet of Lexapro x2 weeks. Then stop ?Start Buspar today.  ?

## 2021-11-05 NOTE — Progress Notes (Signed)
? ?Subjective: ?CC: Anxiety disorder ?PCP: Gwenlyn Fudge, FNP ?TDH:RCBU Capote is a 21 y.o. female presenting to clinic today for: ? ?1.  Anxiety disorder ?Patient is accompanied today's visit by her mother.  The patient notes that she has been dealing with anxiety disorder for a while now.  She was previously treated with Zoloft but did not feel good on that medication, citing that she was especially tired on it.  She was transition over to Prozac and then finally Lexapro.  With each of these medication she has felt not herself, excessively fatigued, having difficulty concentrating and feeling like she is in almost a dream state.  She is currently newly married but living separately from her spouse as he is in the [employer].  She will be moving to [place] in the next couple of months and worries a lot about that.  She is currently enrolled in school and has difficulty even completing some of the tasks with school.  She is often fatigued when she comes home and requires a nap.  Her last menstrual cycle was just a few days ago.  These have been regular and she is compliant with her birth control pill.  No plans for pregnancy in the near future. ? ? ?ROS: Per HPI ? ?Allergies  ?Allergen Reactions  ? Penicillins Hives  ? ?Past Medical History:  ?Diagnosis Date  ? Allergy   ? Anxiety   ? Asthma   ? Auditory processing disorder   ? Hyperacusis   ? Memory difficulty   ? tolerance fading memory per mother  ? Raynaud disease   ? ? ?Current Outpatient Medications:  ?  albuterol (VENTOLIN HFA) 108 (90 Base) MCG/ACT inhaler, Inhale 2 puffs into the lungs every 6 (six) hours as needed for wheezing or shortness of breath., Disp: 54 g, Rfl: 3 ?  escitalopram (LEXAPRO) 10 MG tablet, TAKE 1 TABLET(10 MG) BY MOUTH DAILY, Disp: 90 tablet, Rfl: 1 ?  Fluticasone Propionate, Inhal, (FLOVENT DISKUS) 100 MCG/BLIST AEPB, Inhale 1 Inhaler into the lungs 2 (two) times daily., Disp: 180 each, Rfl: 2 ?  norethindrone-ethinyl estradiol  (LOESTRIN 1/20, 21,) 1-20 MG-MCG tablet, Take 1 tablet by mouth daily., Disp: 28 tablet, Rfl: 11 ?  sertraline (ZOLOFT) 50 MG tablet, Take 1 tablet (50 mg total) by mouth daily for 14 days, THEN 0.5 tablets (25 mg total) daily for 14 days., Disp: 21 tablet, Rfl: 0 ?Social History  ? ?Socioeconomic History  ? Marital status: Single  ?  Spouse name: Not on file  ? Number of children: Not on file  ? Years of education: Not on file  ? Highest education level: Not on file  ?Occupational History  ? Not on file  ?Tobacco Use  ? Smoking status: Never  ? Smokeless tobacco: Never  ?Vaping Use  ? Vaping Use: Never used  ?Substance and Sexual Activity  ? Alcohol use: Never  ? Drug use: Never  ? Sexual activity: Not on file  ?Other Topics Concern  ? Not on file  ?Social History Narrative  ? Not on file  ? ?Social Determinants of Health  ? ?Financial Resource Strain: Not on file  ?Food Insecurity: Not on file  ?Transportation Needs: Not on file  ?Physical Activity: Not on file  ?Stress: Not on file  ?Social Connections: Not on file  ?Intimate Partner Violence: Not on file  ? ?Family History  ?Problem Relation Age of Onset  ? Raynaud syndrome Mother   ? Asthma Mother   ?  Arthritis Mother   ?     Psoriatic or RA  ? Asthma Father   ? Raynaud syndrome Sister   ? Vitiligo Brother   ? Arthritis Maternal Grandmother   ? Thyroid disease Maternal Grandfather   ? Rheum arthritis Maternal Grandfather   ? Heart attack Maternal Grandfather   ? Heart disease Maternal Grandfather   ? Pancreatic cancer Paternal Grandmother   ? Diabetes Paternal Grandmother   ? Prostate cancer Paternal Grandfather   ? Diabetes Paternal Grandfather   ? Breast cancer Other   ? Diabetes Other   ? Cancer Other   ?     Unknown type  ? Diabetes Other   ? Diabetes Maternal Aunt   ? ? ?Objective: ?Office vital signs reviewed. ?BP 113/75   Pulse 81   Temp 97.9 ?F (36.6 ?C)   Ht 5\' 4"  (1.626 m)   Wt 113 lb (51.3 kg)   SpO2 99%   BMI 19.40 kg/m?  ? ?Physical  Examination:  ?General: Awake, alert, well nourished, No acute distress ?Cardio: regular rate and rhythm, S1S2 heard, no murmurs appreciated ?Pulm: clear to auscultation bilaterally, no wheezes, rhonchi or rales; normal work of breathing on room air ?Psych: Mood stable, speech normal, affect appropriate.  Patient is very pleasant and interactive ? ?Depression screen [facility] 2/9 11/05/2021 06/17/2021 06/06/2021  ?Decreased Interest 0 0 0  ?Down, Depressed, Hopeless 0 1 1  ?PHQ - 2 Score 0 1 1  ?Altered sleeping 2 2 2   ?Tired, decreased energy 2 2 2   ?Change in appetite 0 0 0  ?Feeling bad or failure about yourself  0 0 0  ?Trouble concentrating 1 0 0  ?Moving slowly or fidgety/restless 0 0 0  ?Suicidal thoughts 0 0 -  ?PHQ-9 Score 5 5 5   ?Difficult doing work/chores Not difficult at all Not difficult at all Not difficult at all  ?Some recent data might be hidden  ? ?GAD 7 : Generalized Anxiety Score 11/05/2021 06/17/2021 06/06/2021 05/08/2021  ?Nervous, Anxious, on Edge 1 1 1 1   ?Control/stop worrying 0 0 1 1  ?Worry too much - different things 1 1 0 1  ?Trouble relaxing 0 0 0 0  ?Restless 0 0 0 0  ?Easily annoyed or irritable 0 0 0 0  ?Afraid - awful might happen 0 1 1 1   ?Total GAD 7 Score 2 3 3 4   ?Anxiety Difficulty Not difficult at all Not difficult at all - Not difficult at all  ? ? ? ? ?Assessment/ Plan: ?21 y.o. female  ? ?Generalized anxiety disorder - Plan: busPIRone (BUSPAR) 5 MG tablet ? ?I would like to taper off of her Lexapro.  The common theme has been that she is having intolerances with selective serotonin reuptake inhibitors.  I favor trial of BuSpar since her anxiety is reportedly what her biggest concern is at this time.  We will gradually titrate her up to lowest effective dose and I will plan to reconvene with her in about 4 weeks.  We discussed that if she does not have any significant improvement with symptoms on this medication we could consider pursuing an SNRI but traditionally I found that it is more  difficult to taper off of that then BuSpar, so we will reserve the SNRI for refractory symptoms.  Her mother did well with Cymbalta so that may be what we select for this patient if she is not having success with the buspirone.  I counseled her on safe sex practices  and she would contact us immediately if she were to come pregnant on the medications that we are prescribing ? ?No orders of the defined types were placed in this encounter. ? ?No orders of the defined types were placed in this encounter. ? ? ? ?Raliegh Ip, DO ?[facility] ?[phone] ? ?

## 2021-12-09 ENCOUNTER — Ambulatory Visit: Payer: 59 | Admitting: Family Medicine

## 2021-12-09 ENCOUNTER — Encounter: Payer: Self-pay | Admitting: Family Medicine

## 2021-12-09 DIAGNOSIS — F411 Generalized anxiety disorder: Secondary | ICD-10-CM | POA: Diagnosis not present

## 2021-12-09 NOTE — Progress Notes (Signed)
? ?Assessment & Plan:  ?1. Generalized anxiety disorder ?Patient would like to continue BuSpar a little longer to see if the beginning of her panic symptoms resolves. If not, we will switch to a SNRI given she has failed treatment with several SSRIs.  ? ? ?Return in about 3 weeks (around 12/30/2021) for Anxiety (telephone or video). ? ?Hendricks Limes, MSN, APRN, FNP-C ?[place] ? ?Subjective:  ? ? Patient ID: Colleen Mason, female    DOB: [birth year], 21 y.o.   MRN: [id number] ? ?Patient Care Team: ?Loman Brooklyn, FNP as PCP - General (Family Medicine)  ? ?Chief Complaint:  ?Chief Complaint  ?Patient presents with  ? Anxiety  ?  1 month follow up. Patient states that she is still having more anxiety.  ? ? ?HPI: ?Colleen Mason is a 21 y.o. female presenting on 12/09/2021 for Anxiety (1 month follow up. Patient states that she is still having more anxiety.) ? ?Anxiety: patient is following up on starting BuSpar four  weeks ago. At that time Lexapro was stopped due to feeling extremely tired all the time. She states she isn't as tired all the time, but she can now feel the panic building inside her like she is about to have a panic attack. This is happening 1-2 times per week now and was previously not occurring. There is no known trigger to the panic. The only thing that is different for her is that [place] recently [place] She is going to school full time and is working part-time. She rests well at night and gets at least 8 hours of sleep. She has failed therapy with Zoloft and Prozac.  ? ? ?  12/09/2021  ?  3:48 PM 11/05/2021  ?  3:30 PM 06/17/2021  ?  3:27 PM 06/06/2021  ? 12:58 PM  ?GAD 7 : Generalized Anxiety Score  ?Nervous, Anxious, on Edge 1 1 1 1   ?Control/stop worrying 0 0 0 1  ?Worry too much - different things 1 1 1  0  ?Trouble relaxing 0 0 0 0  ?Restless 0 0 0 0  ?Easily annoyed or irritable 0 0 0 0  ?Afraid - awful might happen 0 0 1 1  ?Total GAD 7  Score 2 2 3 3   ?Anxiety Difficulty Not difficult at all Not difficult at all Not difficult at all   ? ? ?New complaints: ?None ? ? ?Social history: ? ?Relevant past medical, surgical, family and social history reviewed and updated as indicated. Interim medical history since our last visit reviewed. ? ?Allergies and medications reviewed and updated. ? ?DATA REVIEWED: CHART IN EPIC ? ?ROS: Negative unless specifically indicated above in HPI.  ? ? ?Current Outpatient Medications:  ?  albuterol (VENTOLIN HFA) 108 (90 Base) MCG/ACT inhaler, Inhale 2 puffs into the lungs every 6 (six) hours as needed for wheezing or shortness of breath., Disp: 54 g, Rfl: 3 ?  busPIRone (BUSPAR) 10 MG tablet, Take 10 mg by mouth daily., Disp: , Rfl:  ?  Fluticasone Propionate, Inhal, (FLOVENT DISKUS) 100 MCG/BLIST AEPB, Inhale 1 Inhaler into the lungs 2 (two) times daily., Disp: 180 each, Rfl: 2 ?  norethindrone-ethinyl estradiol (LOESTRIN 1/20, 21,) 1-20 MG-MCG tablet, Take 1 tablet by mouth daily., Disp: 28 tablet, Rfl: 11 ?  escitalopram (LEXAPRO) 10 MG tablet, TAKE 1 TABLET(10 MG) BY MOUTH DAILY (Patient not taking: Reported on 12/09/2021), Disp: 90 tablet, Rfl: 1 ?  sertraline (ZOLOFT) 50  MG tablet, Take 1 tablet (50 mg total) by mouth daily for 14 days, THEN 0.5 tablets (25 mg total) daily for 14 days. (Patient not taking: Reported on 12/09/2021), Disp: 21 tablet, Rfl: 0  ? ?Allergies  ?Allergen Reactions  ? Penicillins Hives  ? ?Past Medical History:  ?Diagnosis Date  ? Allergy   ? Anxiety   ? Asthma   ? Auditory processing disorder   ? Hyperacusis   ? Memory difficulty   ? tolerance fading memory per mother  ? Raynaud disease   ?  ?History reviewed. No pertinent surgical history.  ?Social History  ? ?Socioeconomic History  ? Marital status: Single  ?  Spouse name: Not on file  ? Number of children: Not on file  ? Years of education: Not on file  ? Highest education level: Not on file  ?Occupational History  ? Not on file  ?Tobacco Use   ? Smoking status: Never  ? Smokeless tobacco: Never  ?Vaping Use  ? Vaping Use: Never used  ?Substance and Sexual Activity  ? Alcohol use: Never  ? Drug use: Never  ? Sexual activity: Not on file  ?Other Topics Concern  ? Not on file  ?Social History Narrative  ? Not on file  ? ?Social Determinants of Health  ? ?Financial Resource Strain: Not on file  ?Food Insecurity: Not on file  ?Transportation Needs: Not on file  ?Physical Activity: Not on file  ?Stress: Not on file  ?Social Connections: Not on file  ?Intimate Partner Violence: Not on file  ?  ? ?   ?Objective:  ?  ?BP 121/77   Pulse (!) 104   Temp 98.3 ?F (36.8 ?C) (Temporal)   Ht 5' 4"  (1.626 m)   Wt 111 lb 6.4 oz (50.5 kg)   BMI 19.12 kg/m?  ? ?Wt Readings from Last 3 Encounters:  ?12/09/21 111 lb 6.4 oz (50.5 kg)  ?11/05/21 113 lb (51.3 kg)  ?06/17/21 117 lb 3.2 oz (53.2 kg)  ? ? ?Physical Exam ?Vitals reviewed.  ?Constitutional:   ?   General: She is not in acute distress. ?   Appearance: Normal appearance. She is underweight. She is not ill-appearing, toxic-appearing or diaphoretic.  ?HENT:  ?   Head: Normocephalic and atraumatic.  ?Eyes:  ?   General: No scleral icterus.    ?   Right eye: No discharge.     ?   Left eye: No discharge.  ?   Conjunctiva/sclera: Conjunctivae normal.  ?Cardiovascular:  ?   Rate and Rhythm: Normal rate.  ?Pulmonary:  ?   Effort: Pulmonary effort is normal. No respiratory distress.  ?Musculoskeletal:     ?   General: Normal range of motion.  ?   Cervical back: Normal range of motion.  ?Skin: ?   General: Skin is warm and dry.  ?   Capillary Refill: Capillary refill takes less than 2 seconds.  ?Neurological:  ?   General: No focal deficit present.  ?   Mental Status: She is alert and oriented to person, place, and time. Mental status is at baseline.  ?Psychiatric:     ?   Mood and Affect: Mood normal.     ?   Behavior: Behavior normal.     ?   Thought Content: Thought content normal.     ?   Judgment: Judgment normal.   ? ? ?Lab Results  ?Component Value Date  ? TSH 2.590 03/06/2021  ? ?Lab Results  ?Component  Value Date  ? WBC 3.7 03/06/2021  ? HGB 14.4 03/06/2021  ? HCT 43.6 03/06/2021  ? MCV 92 03/06/2021  ? PLT 356 03/06/2021  ? ?Lab Results  ?Component Value Date  ? NA 138 03/06/2021  ? K 4.5 03/06/2021  ? CO2 24 03/06/2021  ? GLUCOSE 78 03/06/2021  ? BUN 10 03/06/2021  ? CREATININE 0.93 03/06/2021  ? BILITOT 0.3 03/06/2021  ? ALKPHOS 41 (L) 03/06/2021  ? AST 15 03/06/2021  ? ALT 11 03/06/2021  ? PROT 7.2 03/06/2021  ? ALBUMIN 4.7 03/06/2021  ? CALCIUM 9.5 03/06/2021  ? EGFR 90 03/06/2021  ? ?No results found for: CHOL ?No results found for: HDL ?No results found for: [place] ?No results found for: TRIG ?No results found for: CHOLHDL ?No results found for: HGBA1C ? ?   ? ? ? ? ? ?

## 2021-12-16 ENCOUNTER — Other Ambulatory Visit: Payer: Self-pay | Admitting: Family Medicine

## 2021-12-16 DIAGNOSIS — Z30011 Encounter for initial prescription of contraceptive pills: Secondary | ICD-10-CM

## 2021-12-25 ENCOUNTER — Ambulatory Visit (INDEPENDENT_AMBULATORY_CARE_PROVIDER_SITE_OTHER): Payer: 59 | Admitting: Family Medicine

## 2021-12-25 DIAGNOSIS — F411 Generalized anxiety disorder: Secondary | ICD-10-CM | POA: Diagnosis not present

## 2021-12-25 NOTE — Progress Notes (Signed)
? ?Virtual Visit via Telephone Note ? ?I connected with Colleen Mason on 12/25/21 at 4:05 PM by telephone and verified that I am speaking with the correct person using two identifiers. Colleen Mason is currently located at home and her husband is currently with her during this visit. The provider, Loman Brooklyn, FNP is located in their office at time of visit. ? ?I discussed the limitations, risks, security and privacy concerns of performing an evaluation and management service by telephone and the availability of in person appointments. I also discussed with the patient that there may be a patient responsible charge related to this service. The patient expressed understanding and agreed to proceed. ? ?Subjective: ?PCP: Loman Brooklyn, FNP ? ?Chief Complaint  ?Patient presents with  ? Anxiety  ? ?Anxiety: Patient is following up on BuSpar that was started in March.  When we last spoke 3 weeks ago she was still feeling like the panic was coming, but she was not getting to a panic attack.  We decided to leave her on the medication a little longer to see if this resolved.  She reports today it has resolved and she is having no more flares.  She has previously failed treatment with Lexapro, Prozac and Zoloft. ? ? ?  12/09/2021  ?  3:48 PM 11/05/2021  ?  3:30 PM 06/17/2021  ?  3:27 PM 06/06/2021  ? 12:58 PM  ?GAD 7 : Generalized Anxiety Score  ?Nervous, Anxious, on Edge 1 1 1 1   ?Control/stop worrying 0 0 0 1  ?Worry too much - different things 1 1 1  0  ?Trouble relaxing 0 0 0 0  ?Restless 0 0 0 0  ?Easily annoyed or irritable 0 0 0 0  ?Afraid - awful might happen 0 0 1 1  ?Total GAD 7 Score 2 2 3 3   ?Anxiety Difficulty Not difficult at all Not difficult at all Not difficult at all   ? ? ?ROS: Per HPI ? ?Current Outpatient Medications:  ?  albuterol (VENTOLIN HFA) 108 (90 Base) MCG/ACT inhaler, Inhale 2 puffs into the lungs every 6 (six) hours as needed for wheezing or shortness of breath., Disp: 54 g, Rfl: 3 ?  busPIRone  (BUSPAR) 10 MG tablet, Take 10 mg by mouth daily., Disp: , Rfl:  ?  escitalopram (LEXAPRO) 10 MG tablet, TAKE 1 TABLET(10 MG) BY MOUTH DAILY (Patient not taking: Reported on 12/09/2021), Disp: 90 tablet, Rfl: 1 ?  Fluticasone Propionate, Inhal, (FLOVENT DISKUS) 100 MCG/BLIST AEPB, Inhale 1 Inhaler into the lungs 2 (two) times daily., Disp: 180 each, Rfl: 2 ?  norethindrone-ethinyl estradiol (LOESTRIN) 1-20 MG-MCG tablet, TAKE 1 TABLET BY MOUTH EVERY DAY, Disp: 84 tablet, Rfl: 0 ?  sertraline (ZOLOFT) 50 MG tablet, Take 1 tablet (50 mg total) by mouth daily for 14 days, THEN 0.5 tablets (25 mg total) daily for 14 days. (Patient not taking: Reported on 12/09/2021), Disp: 21 tablet, Rfl: 0 ? ?Allergies  ?Allergen Reactions  ? Penicillins Hives  ? ?Past Medical History:  ?Diagnosis Date  ? Allergy   ? Anxiety   ? Asthma   ? Auditory processing disorder   ? Hyperacusis   ? Memory difficulty   ? tolerance fading memory per mother  ? Raynaud disease   ? ? ?Observations/Objective: ?A&O  ?No respiratory distress or wheezing audible over the phone ?Mood, judgement, and thought processes all WNL ? ?Assessment and Plan: ?1. Generalized anxiety disorder ?Well controlled on current regimen.  ? ? ?Follow  Up Instructions: ?Return as scheduled. ? ?I discussed the assessment and treatment plan with the patient. The patient was provided an opportunity to ask questions and all were answered. The patient agreed with the plan and demonstrated an understanding of the instructions. ?  ?The patient was advised to call back or seek an in-person evaluation if the symptoms worsen or if the condition fails to improve as anticipated. ? ?The above assessment and management plan was discussed with the patient. The patient verbalized understanding of and has agreed to the management plan. Patient is aware to call the clinic if symptoms persist or worsen. Patient is aware when to return to the clinic for a follow-up visit. Patient educated on when it is  appropriate to go to the emergency department.  ? ?Time call ended: 4:16 PM ? ?I provided 11 minutes of non-face-to-face time during this encounter. ? ?Hendricks Limes, MSN, APRN, FNP-C ?[name] ?12/25/21 ? ? ?

## 2021-12-26 ENCOUNTER — Encounter: Payer: Self-pay | Admitting: Family Medicine

## 2022-02-05 ENCOUNTER — Other Ambulatory Visit: Payer: Self-pay | Admitting: Family Medicine

## 2022-02-05 DIAGNOSIS — F411 Generalized anxiety disorder: Secondary | ICD-10-CM

## 2022-02-06 ENCOUNTER — Encounter: Payer: Self-pay | Admitting: Nurse Practitioner

## 2022-02-06 ENCOUNTER — Ambulatory Visit: Payer: 59 | Admitting: Nurse Practitioner

## 2022-02-06 VITALS — BP 125/81 | HR 98 | Temp 98.6°F | Ht 65.0 in | Wt 108.0 lb

## 2022-02-06 DIAGNOSIS — R21 Rash and other nonspecific skin eruption: Secondary | ICD-10-CM

## 2022-02-06 MED ORDER — METHYLPREDNISOLONE ACETATE 40 MG/ML IJ SUSP
40.0000 mg | Freq: Once | INTRAMUSCULAR | Status: AC
  Administered 2022-02-06: 40 mg via INTRAMUSCULAR

## 2022-02-06 MED ORDER — CETIRIZINE HCL 10 MG PO TABS: 10.0000 mg | ORAL_TABLET | Freq: Every day | ORAL | 11 refills | Status: AC

## 2022-02-06 MED ORDER — PREDNISONE 20 MG PO TABS: 20.0000 mg | ORAL_TABLET | Freq: Every day | ORAL | 0 refills | Status: DC

## 2022-02-06 MED ORDER — HYDROCORTISONE 1 % EX LOTN: 1.0000 "application " | TOPICAL_LOTION | Freq: Two times a day (BID) | CUTANEOUS | 0 refills | Status: DC

## 2022-02-06 NOTE — Progress Notes (Signed)
Acute Office Visit  Subjective:     Patient ID: Colleen Mason, female    DOB: May 07, 2001, 21 y.o.   MRN: [id number]  Chief Complaint  Patient presents with   Rash    On stomach and back states there a spot on her side it has been there for about a year , it has moved to her back and under her breast     Rash This is a new problem. The current episode started in the past 7 days. The problem has been gradually worsening since onset. The affected locations include the back (right flank area). The rash is characterized by dryness, redness and itchiness. She was exposed to a new detergent/soap. Pertinent negatives include no facial edema, fever, rhinorrhea, shortness of breath or sore throat. Past treatments include nothing.    Review of Systems  Constitutional: Negative.  Negative for fever.  HENT:  Negative for rhinorrhea and sore throat.   Eyes: Negative.   Respiratory: Negative.  Negative for shortness of breath.   Cardiovascular: Negative.   Genitourinary: Negative.   Skin:  Positive for itching and rash.  All other systems reviewed and are negative.      Objective:    BP 125/81   Pulse 98   Temp 98.6 F (37 C)   Ht 5\' 5"  (1.651 m)   Wt 108 lb (49 kg)   LMP 12/08/2021 (Approximate)   SpO2 99%   BMI 17.97 kg/m  BP Readings from Last 3 Encounters:  02/06/22 125/81  12/09/21 121/77  11/05/21 113/75   Wt Readings from Last 3 Encounters:  02/06/22 108 lb (49 kg)  12/09/21 111 lb 6.4 oz (50.5 kg)  11/05/21 113 lb (51.3 kg)      Physical Exam Vitals and nursing note reviewed.  Constitutional:      Appearance: Normal appearance.  HENT:     Head: Normocephalic.     Right Ear: External ear normal.     Left Ear: External ear normal.     Mouth/Throat:     Pharynx: Oropharynx is clear.  Eyes:     Conjunctiva/sclera: Conjunctivae normal.  Cardiovascular:     Rate and Rhythm: Normal rate and regular rhythm.     Pulses: Normal pulses.     Heart sounds: Normal heart  sounds.  Pulmonary:     Effort: Pulmonary effort is normal.     Breath sounds: Normal breath sounds.  Abdominal:     General: Bowel sounds are normal.  Skin:    General: Skin is dry.     Findings: Rash present. Rash is urticarial.  Neurological:     Mental Status: She is alert and oriented to person, place, and time.    No results found for any visits on 02/06/22.      Assessment & Plan:  Contact dermatitis/rash not well controlled. Symptoms are similar to skin rash about a year ago. -40 Depo-Medrol shot given in clinic -Hydrocortisone 1% topical cream. -Keep skin moisturized -Avoid hot showers and scratching skin with nails -Daily prednisone for 6 days with breakfast -Patient recently changed shampoo and conditioner-advised patient to -discontinue product and wait to see if symptoms better.  Problem List Items Addressed This Visit   None Visit Diagnoses     Rash, skin    -  Primary   Relevant Medications   predniSONE (DELTASONE) 20 MG tablet   cetirizine (ZYRTEC) 10 MG tablet   hydrocortisone 1 % lotion   Other Relevant Orders   Ambulatory referral  to Allergy       Meds ordered this encounter  Medications   predniSONE (DELTASONE) 20 MG tablet    Sig: Take 1 tablet (20 mg total) by mouth daily with breakfast.    Dispense:  6 tablet    Refill:  0    Order Specific Question:   Supervising Provider    Answer:   Mechele Claude [[id number]]   cetirizine (ZYRTEC) 10 MG tablet    Sig: Take 1 tablet (10 mg total) by mouth daily.    Dispense:  30 tablet    Refill:  11    Order Specific Question:   Supervising Provider    Answer:   Mechele Claude [phone]   hydrocortisone 1 % lotion    Sig: Apply 1 application. topically 2 (two) times daily.    Dispense:  118 mL    Refill:  0    Order Specific Question:   Supervising Provider    Answer:   Mechele Claude [[id number]]    Return if symptoms worsen or fail to improve.  Daryll Drown, NP

## 2022-02-06 NOTE — Addendum Note (Signed)
Addended by: Waynette Buttery on: 02/06/2022 01:57 PM   Modules accepted: Orders

## 2022-02-06 NOTE — Patient Instructions (Signed)
Contact Dermatitis Dermatitis is redness, soreness, and swelling (inflammation) of the skin. Contact dermatitis is a reaction to certain substances that touch the skin. Many different substances can cause contact dermatitis. There are two types of contact dermatitis: Irritant contact dermatitis. This type is caused by something that irritates your skin, such as having dry hands from washing them too often with soap. This type does not require previous exposure to the substance for a reaction to occur. This is the most common type. Allergic contact dermatitis. This type is caused by a substance that you are allergic to, such as poison ivy. This type occurs when you have been exposed to the substance (allergen) and develop a sensitivity to it. Dermatitis may develop soon after your first exposure to the allergen, or it may not develop until the next time you are exposed and every time thereafter. What are the causes? Irritant contact dermatitis is most commonly caused by exposure to: Makeup. Soaps. Detergents. Bleaches. Acids. Metal salts, such as nickel. Allergic contact dermatitis is most commonly caused by exposure to: Poisonous plants. Chemicals. Jewelry. Latex. Medicines. Preservatives in products, such as clothing. What increases the risk? You are more likely to develop this condition if you have: A job that exposes you to irritants or allergens. Certain medical conditions, such as asthma or eczema. What are the signs or symptoms? Symptoms of this condition may occur on your body anywhere the irritant has touched you or is touched by you. Symptoms include: Dryness or flaking. Redness. Cracks. Itching. Pain or a burning feeling. Blisters. Drainage of small amounts of blood or clear fluid from skin cracks. With allergic contact dermatitis, there may also be swelling in areas such as the eyelids, mouth, or genitals. How is this diagnosed? This condition is diagnosed with a medical  history and physical exam. A patch skin test may be performed to help determine the cause. If the condition is related to your job, you may need to see an occupational medicine specialist. How is this treated? This condition is treated by checking for the cause of the reaction and protecting your skin from further contact. Treatment may also include: Steroid creams or ointments. Oral steroid medicines may be needed in more severe cases. Antibiotic medicines or antibacterial ointments, if a skin infection is present. Antihistamine lotion or an antihistamine taken by mouth to ease itching. A bandage (dressing). Follow these instructions at home: Skin care Moisturize your skin as needed. Apply cool compresses to the affected areas. Try applying baking soda paste to your skin. Stir water into baking soda until it reaches a paste-like consistency. Do not scratch your skin, and avoid friction to the affected area. Avoid the use of soaps, perfumes, and dyes. Medicines Take or apply over-the-counter and prescription medicines only as told by your health care provider. If you were prescribed an antibiotic medicine, take or apply the antibiotic as told by your health care provider. Do not stop using the antibiotic even if your condition improves. Bathing Try taking a bath with: Epsom salts. Follow the instructions on the packaging. You can get these at your local pharmacy or grocery store. Baking soda. Pour a small amount into the bath as directed by your health care provider. Colloidal oatmeal. Follow the instructions on the packaging. You can get this at your local pharmacy or grocery store. Bathe less frequently, such as every other day. Bathe in lukewarm water. Avoid using hot water. Bandage care If you were given a bandage (dressing), change it as told   by your health care provider. Wash your hands with soap and water before and after you change your dressing. If soap and water are not  available, use hand sanitizer. General instructions Avoid the substance that caused your reaction. If you do not know what caused it, keep a journal to try to track what caused it. Write down: What you eat. What cosmetic products you use. What you drink. What you wear in the affected area. This includes jewelry. Check the affected areas every day for signs of infection. Check for: More redness, swelling, or pain. More fluid or blood. Warmth. Pus or a bad smell. Keep all follow-up visits as told by your health care provider. This is important. Contact a health care provider if: Your condition does not improve with treatment. Your condition gets worse. You have signs of infection such as swelling, tenderness, redness, soreness, or warmth in the affected area. You have a fever. You have new symptoms. Get help right away if: You have a severe headache, neck pain, or neck stiffness. You vomit. You feel very sleepy. You notice red streaks coming from the affected area. Your bone or joint underneath the affected area becomes painful after the skin has healed. The affected area turns darker. You have difficulty breathing. Summary Dermatitis is redness, soreness, and swelling (inflammation) of the skin. Contact dermatitis is a reaction to certain substances that touch the skin. Symptoms of this condition may occur on your body anywhere the irritant has touched you or is touched by you. This condition is treated by figuring out what caused the reaction and protecting your skin from further contact. Treatment may also include medicines and skin care. Avoid the substance that caused your reaction. If you do not know what caused it, keep a journal to try to track what caused it. Contact a health care provider if your condition gets worse or you have signs of infection such as swelling, tenderness, redness, soreness, or warmth in the affected area. This information is not intended to replace  advice given to you by your health care provider. Make sure you discuss any questions you have with your health care provider. Document Revised: 06/09/2021 Document Reviewed: 06/09/2021 Elsevier Patient Education  2023 Elsevier Inc.  

## 2022-03-04 ENCOUNTER — Encounter: Payer: 59 | Admitting: Family Medicine

## 2022-03-08 ENCOUNTER — Other Ambulatory Visit: Payer: Self-pay | Admitting: Family Medicine

## 2022-03-08 DIAGNOSIS — Z30011 Encounter for initial prescription of contraceptive pills: Secondary | ICD-10-CM

## 2022-04-22 ENCOUNTER — Ambulatory Visit: Payer: 59 | Admitting: Allergy & Immunology

## 2022-04-22 ENCOUNTER — Encounter: Payer: Self-pay | Admitting: Allergy & Immunology

## 2022-04-22 VITALS — BP 122/84 | HR 94 | Temp 98.6°F | Resp 20 | Ht 64.0 in | Wt 108.2 lb

## 2022-04-22 DIAGNOSIS — J4540 Moderate persistent asthma, uncomplicated: Secondary | ICD-10-CM

## 2022-04-22 DIAGNOSIS — R21 Rash and other nonspecific skin eruption: Secondary | ICD-10-CM | POA: Diagnosis not present

## 2022-04-22 MED ORDER — TRIAMCINOLONE ACETONIDE 0.1 % EX CREA: 1.0000 | TOPICAL_CREAM | Freq: Two times a day (BID) | CUTANEOUS | 3 refills | Status: DC

## 2022-04-22 MED ORDER — FLOVENT DISKUS 100 MCG/ACT IN AEPB: 1.0000 | INHALATION_SPRAY | Freq: Two times a day (BID) | RESPIRATORY_TRACT | 3 refills | Status: DC

## 2022-04-22 NOTE — Progress Notes (Signed)
NEW PATIENT  Date of Service/Encounter:  04/22/22  Consult requested by: Gwenlyn Fudge, FNP   Assessment:   Moderate persistent asthma without complication   Rash  Plan/Recommendations:   1. Rash - We are going to get a slew of labs to try to figure out what is going on. - Labs ordered through [facility].  - We will call you in 1-2 weeks with the results of the testing.  - We are going to add on triamcinolone 0.1% cream to use twice daily as needed. - This looks like contact dermatitis, but that should itch.  - I would consider doing patch testing (these are placed on Monday and then read on Wednesday and Friday).  2. Moderate persistent asthma without complication - We could not do lung testing because our spirometry was going haywire. - Try to be more consistent with the Flovent one puff twice daily.  - Hopefully this will help control your symptoms more effectively.  3. Return in about 3 months (around 07/23/2022).   This note in its entirety was forwarded to the Provider who requested this consultation.  Subjective:   Colleen Mason is a 21 y.o. female presenting today for evaluation of  Chief Complaint  Patient presents with   Allergy Testing    Environmental: All Food: no hx   Rash    Back - all; abdomen - right side; buttocks - bilateral; breast - under - bilateral Patient states abdomen was noticed in 2022; back, buttocks and breast since May'23.   Asthma    Patient states since relocating to [facility] in March '23 she has been having a harder time breathing - using her inhaler more.    Colleen Mason has a history of the following: Patient Active Problem List   Diagnosis Date Noted   Exposure to STD 06/06/2021   Generalized anxiety disorder 02/28/2021   Cutaneous abscess of left foot 02/11/2021   Rash 01/03/2021   Moderate persistent asthma without complication 09/18/2019    History obtained from: chart review and patient and mother.  Colleen Mason  was referred by Gwenlyn Fudge, FNP.     Colleen Mason is a 21 y.o. female presenting for an evaluation of a rash and asthma .   Asthma/Respiratory Symptom History: She has had asthma since she was very young. It was exercise induced when she was very young. But now it is acting up whenever it wants to. It has been problematic since she moved to [place].  She is living in an apartment and they are unsure of any mold exposure at all. She has not needed steroids for her breathing. She has never been admitted to the hospital for her asthma. She has been prescribed Flovent diskus around one year ago, but she is not great about using it on a routine basis. She has been the best at taking it. She denies nighttime coughing.   Allergic Rhinitis Symptom History: She has been sneezing more lately, especially in the spring time. She has never undergone allergy testing in the past.  She does not use much in the way of allergy medications. She does not get antibiotics at all for her symptoms.   Skin Symptom History: She has a rash that extends all of the way down her back. It started last summer initially and remained stable, but then worsened in the beginning of this current summer. Then 2-3 months ago, down her back and under her breast. The one on her back recently started itching a  few weeks ago. The rest of it does not itch. She has never had it before. She has tried some hydrocortisone cream and she tried this for one week without improvement. She was also given some kind of prednisone that did not help at all. She uses whatever is available in the store. She does use some scented ones occasionally.  She has tried an "all natural" one due to the rash and it did not seem to help at all. She has not taken any antihistamine at all. She did take Singulair at some point but no longer does that. This was for her breathing and this was a while ago. She has not seen a Dermatologist for this at all. She has not eaten anything  differently.   There is no systemic symptoms with this at all. She has not had any anaphylaxis at all.   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 She moved to [place] with her husband who is in the [employer].  Otherwise, there is no history of other atopic diseases, including asthma, food allergies, drug allergies, environmental allergies, or contact dermatitis. There is no significant infectious history. Vaccinations are up to date.    Past Medical History: Patient Active Problem List   Diagnosis Date Noted   Exposure to STD 06/06/2021   Generalized anxiety disorder 02/28/2021   Cutaneous abscess of left foot 02/11/2021   Rash 01/03/2021   Moderate persistent asthma without complication 09/18/2019    Medication List:  Allergies as of 04/22/2022       Reactions   Penicillins Hives        Medication List        Accurate as of April 22, 2022 11:59 PM. If you have any questions, ask your nurse or doctor.          STOP taking these medications    predniSONE 20 MG tablet Commonly known as: DELTASONE Stopped by: Alfonse Spruce, MD       TAKE these medications    albuterol 108 (90 Base) MCG/ACT inhaler Commonly known as: VENTOLIN HFA Inhale 2 puffs into the lungs every 6 (six) hours as needed for wheezing or shortness of breath.   busPIRone 10 MG tablet Commonly known as: BUSPAR Take 1 tablet (10 mg total) by mouth daily.   cetirizine 10 MG tablet Commonly known as: ZYRTEC Take 1 tablet (10 mg total) by mouth daily.   Flovent Diskus 100 MCG/ACT Aepb Generic drug: Fluticasone Propionate (Inhal) Inhale 1 puff into the lungs in the morning and at bedtime. What changed:  medication strength how much to take when to take this Changed by: Alfonse Spruce, MD   hydrocortisone 1 % lotion Apply 1 application. topically 2 (two) times daily.   norethindrone-ethinyl estradiol 1-20  MG-MCG tablet Commonly known as: LOESTRIN TAKE 1 TABLET BY MOUTH DAILY   triamcinolone cream 0.1 % Commonly known as: KENALOG Apply 1 Application topically 2 (two) times daily. Started by: Alfonse Spruce, MD        Birth History: non-contributory  Developmental History: non-contributory  Past Surgical History: History reviewed. No pertinent surgical history.   Family History: Family History  Problem Relation Age of Onset   Raynaud syndrome Mother    Asthma Mother    Arthritis Mother        Psoriatic or RA   Asthma Father    Raynaud syndrome Sister  Vitiligo Brother    Arthritis Maternal Grandmother    Thyroid disease Maternal Grandfather    Rheum arthritis Maternal Grandfather    Heart attack Maternal Grandfather    Heart disease Maternal Grandfather    Pancreatic cancer Paternal Grandmother    Diabetes Paternal Grandmother    Prostate cancer Paternal Grandfather    Diabetes Paternal Grandfather    Breast cancer Other    Diabetes Other    Cancer Other        Unknown type   Diabetes Other    Diabetes Maternal Aunt      Social History: Melicia lives at home with her husband now.  She lives in an apartment of unknown age.  Her husband is active in the miliary and they live in [place]. There is vinyl flooring throughout the home.  She has electric heating and central cooling.  There is a dog and a cat inside of the home.  There are no dust mite covers on the bedding.  There is no tobacco exposure.  She currently works as a Production designer, theatre/television/film for the past month.  She is exposed to fumes, chemicals, and dust.  [place] or [place].  She does use a HEPA filter.   Review of Systems  Constitutional: Negative.  Negative for chills, fever, malaise/fatigue and weight loss.  HENT: Negative.  Negative for congestion, ear discharge and ear pain.        Positive for postnasal drip. Positive for nasal congestion.   Eyes:  Negative for  pain, discharge and redness.  Respiratory:  Negative for cough, sputum production, shortness of breath and wheezing.   Cardiovascular: Negative.  Negative for chest pain and palpitations.  Gastrointestinal:  Negative for abdominal pain, constipation, diarrhea, heartburn, nausea and vomiting.  Skin:  Positive for itching and rash.  Neurological:  Negative for dizziness and headaches.  Endo/Heme/Allergies:  Negative for environmental allergies. Does not bruise/bleed easily.       Objective:   Blood pressure 122/84, pulse 94, temperature 98.6 F (37 C), resp. rate 20, height 5\' 4"  (1.626 m), weight 108 lb 3.2 oz (49.1 kg), SpO2 95 %. Body mass index is 18.57 kg/m.     Physical Exam Constitutional:      Appearance: She is well-developed.  HENT:     Head: Normocephalic and atraumatic.     Right Ear: Tympanic membrane, ear canal and external ear normal. No drainage, swelling or tenderness. Tympanic membrane is not injected, scarred, erythematous, retracted or bulging.     Left Ear: Tympanic membrane, ear canal and external ear normal. No drainage, swelling or tenderness. Tympanic membrane is not injected, scarred, erythematous, retracted or bulging.     Nose: No nasal deformity, septal deviation, mucosal edema or rhinorrhea.     Right Sinus: No maxillary sinus tenderness or frontal sinus tenderness.     Left Sinus: No maxillary sinus tenderness or frontal sinus tenderness.     Mouth/Throat:     Mouth: Mucous membranes are not pale and not dry.     Pharynx: Uvula midline.  Eyes:     General:        Right eye: No discharge.        Left eye: No discharge.     Conjunctiva/sclera: Conjunctivae normal.     Right eye: Right conjunctiva is not injected. No chemosis.    Left eye: Left conjunctiva is not injected. No chemosis.    Pupils: Pupils are equal, round, and reactive to  light.  Cardiovascular:     Rate and Rhythm: Normal rate and regular rhythm.     Heart sounds: Normal heart  sounds.  Pulmonary:     Effort: Pulmonary effort is normal. No tachypnea, accessory muscle usage or respiratory distress.     Breath sounds: Normal breath sounds. No wheezing, rhonchi or rales.  Chest:     Chest wall: No tenderness.  Abdominal:     Tenderness: There is no abdominal tenderness. There is no guarding or rebound.  Lymphadenopathy:     Head:     Right side of head: No submandibular, tonsillar or occipital adenopathy.     Left side of head: No submandibular, tonsillar or occipital adenopathy.     Cervical: No cervical adenopathy.  Skin:    General: Skin is warm.     Capillary Refill: Capillary refill takes less than 2 seconds.     Coloration: Skin is not pale.     Findings: Rash present. No abrasion, erythema or petechiae. Rash is not papular, urticarial or vesicular.     Comments: She does have a fine evanescent rash on the abdomen as well as extending to the back. They are fairly flat.   Neurological:     Mental Status: She is alert.           Diagnostic studies: labs sent instead         Malachi Bonds, MD [facility]

## 2022-04-22 NOTE — Patient Instructions (Addendum)
1. Rash - We are going to get a slew of labs to try to figure out what is going on. - Labs ordered through [facility].  - We will call you in 1-2 weeks with the results of the testing.  - We are going to add on triamcinolone 0.1% cream to use twice daily as needed. - This looks like contact dermatitis, but that should itch.  - I would consider doing patch testing (these are placed on Monday and then read on Wednesday and Friday).  2. Moderate persistent asthma without complication - We could not do lung testing because our spirometry was going haywire. - Try to be more consistent with the Flovent one puff twice daily.   3. Return in about 3 months (around 07/23/2022).    Please inform us of any Emergency Department visits, hospitalizations, or changes in symptoms. Call us before going to the ED for breathing or allergy symptoms since we might be able to fit you in for a sick visit. Feel free to contact us anytime with any questions, problems, or concerns.  It was a pleasure to meet you and your mother today!  Websites that have reliable patient information: 1. American Academy of Asthma, Allergy, and Immunology: www.aaaai.org 2. Food Allergy Research and Education (FARE): foodallergy.org 3. Mothers of Asthmatics: http://www.asthmacommunitynetwork.org 4. American College of Allergy, Asthma, and Immunology: www.acaai.org   COVID-19 Vaccine Information can be found at: [email or web address] For questions related to vaccine distribution or appointments, please email [email or web address] or call [phone].   We realize that you might be concerned about having an allergic reaction to the COVID19 vaccines. To help with that concern, WE ARE OFFERING THE COVID19 VACCINES IN OUR OFFICE! Ask the front desk for dates!     "Like" [place] on Facebook and Instagram for our latest updates!      A healthy democracy works best when [employer]  participate! Make sure you are registered to vote! If you have moved or changed any of your contact information, you will need to get this updated before voting!  In some cases, you MAY be able to register to vote online: [email or web address]

## 2022-04-24 ENCOUNTER — Encounter: Payer: Self-pay | Admitting: Allergy & Immunology

## 2022-04-29 LAB — CMP14+EGFR
ALT: 10 IU/L (ref 0–32)
AST: 16 IU/L (ref 0–40)
Albumin/Globulin Ratio: 2.3 — ABNORMAL HIGH (ref 1.2–2.2)
Albumin: 5 g/dL (ref 4.0–5.0)
Alkaline Phosphatase: 36 IU/L — ABNORMAL LOW (ref 44–121)
BUN/Creatinine Ratio: 9 (ref 9–23)
BUN: 8 mg/dL (ref 6–20)
Bilirubin Total: <0.2 mg/dL (ref 0.0–1.2)
CO2: 23 mmol/L (ref 20–29)
Calcium: 9.5 mg/dL (ref 8.7–10.2)
Chloride: 102 mmol/L (ref 96–106)
Creatinine, Ser: 0.93 mg/dL (ref 0.57–1.00)
Globulin, Total: 2.2 g/dL (ref 1.5–4.5)
Glucose: 84 mg/dL (ref 70–99)
Potassium: 4.3 mmol/L (ref 3.5–5.2)
Sodium: 139 mmol/L (ref 134–144)
Total Protein: 7.2 g/dL (ref 6.0–8.5)
eGFR: 90 mL/min/{1.73_m2} (ref >59)

## 2022-04-29 LAB — ALPHA-GAL PANEL
Allergen Lamb IgE: <0.1 kU/L
Beef IgE: <0.1 kU/L
IgE (Immunoglobulin E), Serum: 25 IU/mL (ref 6–495)
O215-IgE Alpha-Gal: <0.1 kU/L
Pork IgE: <0.1 kU/L

## 2022-04-29 LAB — ALLERGENS W/COMP RFLX AREA 2
Alternaria Alternata IgE: <0.1 kU/L
Aspergillus Fumigatus IgE: <0.1 kU/L
Bermuda Grass IgE: <0.1 kU/L
Cedar, Mountain IgE: <0.1 kU/L
Cladosporium Herbarum IgE: <0.1 kU/L
Cockroach, German IgE: <0.1 kU/L
Common Silver Birch IgE: <0.1 kU/L
Cottonwood IgE: <0.1 kU/L
D Farinae IgE: <0.1 kU/L
D Pteronyssinus IgE: <0.1 kU/L
E001-IgE Cat Dander: <0.1 kU/L
E005-IgE Dog Dander: <0.1 kU/L
Elm, American IgE: <0.1 kU/L
Johnson Grass IgE: <0.1 kU/L
Maple/Box Elder IgE: <0.1 kU/L
Mouse Urine IgE: <0.1 kU/L
Oak, White IgE: <0.1 kU/L
Pecan, Hickory IgE: <0.1 kU/L
Penicillium Chrysogen IgE: <0.1 kU/L
Pigweed, Rough IgE: <0.1 kU/L
Ragweed, Short IgE: <0.1 kU/L
Sheep Sorrel IgE Qn: <0.1 kU/L
Timothy Grass IgE: <0.1 kU/L
White Mulberry IgE: <0.1 kU/L

## 2022-04-29 LAB — CBC WITH DIFFERENTIAL
Basophils Absolute: 0.1 10*3/uL (ref 0.0–0.2)
Basos: 1 %
EOS (ABSOLUTE): 0.1 10*3/uL (ref 0.0–0.4)
Eos: 2 %
Hematocrit: 42.6 % (ref 34.0–46.6)
Hemoglobin: 14.2 g/dL (ref 11.1–15.9)
Immature Grans (Abs): 0 10*3/uL (ref 0.0–0.1)
Immature Granulocytes: 0 %
Lymphocytes Absolute: 2.1 10*3/uL (ref 0.7–3.1)
Lymphs: 43 %
MCH: 30.4 pg (ref 26.6–33.0)
MCHC: 33.3 g/dL (ref 31.5–35.7)
MCV: 91 fL (ref 79–97)
Monocytes Absolute: 0.4 10*3/uL (ref 0.1–0.9)
Monocytes: 8 %
Neutrophils Absolute: 2.2 10*3/uL (ref 1.4–7.0)
Neutrophils: 46 %
RBC: 4.67 x10E6/uL (ref 3.77–5.28)
RDW: 12 % (ref 11.7–15.4)
WBC: 4.9 10*3/uL (ref 3.4–10.8)

## 2022-04-29 LAB — ALLERGEN PROFILE, BASIC FOOD
Allergen Corn, IgE: <0.1 kU/L
Chocolate/Cacao IgE: <0.1 kU/L
Egg, Whole IgE: <0.1 kU/L
Food Mix (Seafoods) IgE: NEGATIVE
Milk IgE: <0.1 kU/L
Peanut IgE: <0.1 kU/L
Soybean IgE: <0.1 kU/L
Wheat IgE: <0.1 kU/L

## 2022-04-29 LAB — TRYPTASE: Tryptase: 5.6 ug/L (ref 2.2–13.2)

## 2022-04-29 LAB — THYROID ANTIBODIES
Thyroglobulin Antibody: <1 IU/mL (ref 0.0–0.9)
Thyroperoxidase Ab SerPl-aCnc: <9 IU/mL (ref 0–34)

## 2022-04-29 LAB — C-REACTIVE PROTEIN: CRP: <1 mg/L (ref 0–10)

## 2022-04-29 LAB — SEDIMENTATION RATE: Sed Rate: 3 mm/hr (ref 0–32)

## 2022-04-29 LAB — CHRONIC URTICARIA: cu index: <1 (ref <10)

## 2022-04-29 LAB — ANTINUCLEAR ANTIBODIES, IFA: ANA Titer 1: NEGATIVE

## 2022-05-14 ENCOUNTER — Encounter: Payer: 59 | Admitting: Family Medicine

## 2022-05-26 ENCOUNTER — Encounter: Payer: Self-pay | Admitting: Family Medicine

## 2022-05-26 ENCOUNTER — Ambulatory Visit (INDEPENDENT_AMBULATORY_CARE_PROVIDER_SITE_OTHER): Payer: 59 | Admitting: Family Medicine

## 2022-05-26 ENCOUNTER — Other Ambulatory Visit (HOSPITAL_COMMUNITY)
Admission: RE | Admit: 2022-05-26 | Discharge: 2022-05-26 | Disposition: A | Discharge status: Home / Self Care | Payer: 59 | Source: Ambulatory Visit | Attending: Family Medicine | Admitting: Family Medicine

## 2022-05-26 VITALS — BP 114/72 | HR 88 | Temp 98.0°F | Resp 20 | Ht 64.0 in | Wt 110.0 lb

## 2022-05-26 DIAGNOSIS — F411 Generalized anxiety disorder: Secondary | ICD-10-CM | POA: Diagnosis not present

## 2022-05-26 DIAGNOSIS — Z0001 Encounter for general adult medical examination with abnormal findings: Secondary | ICD-10-CM | POA: Diagnosis not present

## 2022-05-26 DIAGNOSIS — Z124 Encounter for screening for malignant neoplasm of cervix: Secondary | ICD-10-CM

## 2022-05-26 DIAGNOSIS — Z30011 Encounter for initial prescription of contraceptive pills: Secondary | ICD-10-CM | POA: Diagnosis not present

## 2022-05-26 DIAGNOSIS — Z113 Encounter for screening for infections with a predominantly sexual mode of transmission: Secondary | ICD-10-CM

## 2022-05-26 DIAGNOSIS — F410 Panic disorder [episodic paroxysmal anxiety] without agoraphobia: Secondary | ICD-10-CM

## 2022-05-26 DIAGNOSIS — Z0000 Encounter for general adult medical examination without abnormal findings: Secondary | ICD-10-CM

## 2022-05-26 MED ORDER — BUSPIRONE HCL 10 MG PO TABS: 10.0000 mg | ORAL_TABLET | Freq: Every day | ORAL | 3 refills | Status: DC

## 2022-05-26 MED ORDER — NORETHINDRONE ACET-ETHINYL EST 1-20 MG-MCG PO TABS: 1.0000 | ORAL_TABLET | Freq: Every day | ORAL | 3 refills | Status: DC

## 2022-05-26 MED ORDER — PROPRANOLOL HCL 10 MG PO TABS: 10.0000 mg | ORAL_TABLET | Freq: Three times a day (TID) | ORAL | 2 refills | Status: DC | PRN

## 2022-05-26 NOTE — Patient Instructions (Signed)
Schedule an eye exam

## 2022-05-26 NOTE — Progress Notes (Signed)
Assessment & Plan:  Well adult exam Discussed health benefits of physical activity, and encouraged her to engage in regular exercise appropriate for her age and condition. Preventive health education provided. Declined COVID & influenza vaccines, hepatitis C screening, and HPV vaccines.   Immunization History  Administered Date(s) Administered   DTaP 04/14/2001, 06/14/2001, 08/17/2001, 06/20/2002, 03/23/2006   HIB (PRP-OMP) 04/14/2001, 06/14/2001, 08/17/2001, 04/20/2002   Hepatitis B [birth year], 04/14/2001, 08/17/2001   IPV 04/14/2001, 06/14/2001, 04/20/2002, 03/23/2006   MMR 04/20/2002, 03/23/2006   Meningococcal Conjugate 03/07/2012, 07/04/2018   Pneumococcal Conjugate-13 04/14/2001, 06/14/2001, 04/20/2002, 06/20/2002   Tdap 03/02/2011, 02/28/2021   Varicella 03/23/2006   Health Maintenance  Topic Date Due   PAP-Cervical Cytology Screening  Never done   PAP SMEAR-Modifier  Never done   COVID-19 Vaccine (1) 06/11/2022 (Originally 08/10/2001)   INFLUENZA VACCINE  12/06/2022 (Originally 04/07/2022)   HPV VACCINES (1 - 2-dose series) 05/27/2023 (Originally 02/09/2012)   Hepatitis C Screening  05/27/2023 (Originally 02/09/2019)   TETANUS/TDAP  03/01/2031   HIV Screening  Completed   2. Generalized anxiety disorder Well controlled on current regimen.  - busPIRone (BUSPAR) 10 MG tablet; Take 1 tablet (10 mg total) by mouth daily.  Dispense: 90 tablet; Refill: 3  3. Panic attacks New start propranolol. - propranolol (INDERAL) 10 MG tablet; Take 1 tablet (10 mg total) by mouth 3 (three) times daily as needed.  Dispense: 15 tablet; Refill: 2  4. Encounter for prescription of oral contraceptives - norethindrone-ethinyl estradiol (LOESTRIN) 1-20 MG-MCG tablet; Take 1 tablet by mouth daily.  Dispense: 84 tablet; Refill: 3  5. Screening for cervical cancer - Cytology - PAP  6. Routine screening for STI (sexually transmitted infection) - Cytology - PAP   Follow-up: Return in about 1 year  (around 05/27/2023) for annual physical.   Hendricks Limes, MSN, APRN, FNP-C [name]  Subjective:  Patient ID: Colleen Mason, female    DOB: [birth year]  Age: 21 y.o. MRN: [id number]  Patient Care Team: Loman Brooklyn, FNP as PCP - General (Family Medicine)   CC:  Chief Complaint  Patient presents with   Annual Exam    PAP/CPE     HPI Colleen Mason is a 21 y.o. female who presents today for a complete physical exam. She reports consuming a general diet. The patient does not participate in regular exercise at present. She generally feels well. She reports sleeping well. She does not have additional problems to discuss today.   Vision:Not within last year Dental:Receives regular dental care  DEPRESSION SCREENING    05/26/2022    1:24 PM 02/06/2022   10:40 AM 12/09/2021    3:48 PM 11/05/2021    3:30 PM 06/17/2021    3:27 PM 06/06/2021   12:57 PM 05/08/2021    3:33 PM  PHQ 2/9 Scores  PHQ - 2 Score 0 0 0 0 1 1 0  PHQ- 9 Score _0 Anxiety:      05/26/2022    1:24 PM 12/09/2021    3:48 PM 11/05/2021    3:30 PM 06/17/2021    3:27 PM  GAD 7 : Generalized Anxiety Score  Nervous, Anxious, on Edge _1 Control/stop worrying 1 0 0 0  Worry too much - different things _2 Trouble relaxing 0 0 0 0  Restless 0 0 0 0  Easily annoyed or irritable 0 0 0 0  Afraid - awful  might happen 0 0 0 1  Total GAD 7 Score _0 Anxiety Difficulty Not difficult at all Not difficult at all Not difficult at all Not difficult at all    Review of Systems  Constitutional:  Negative for chills, fever, malaise/fatigue and weight loss.  HENT:  Negative for congestion, ear discharge, ear pain, nosebleeds, sinus pain, sore throat and tinnitus.        Tonsil stones.  Eyes:  Negative for blurred vision, double vision, pain, discharge and redness.  Respiratory:  Negative for cough, shortness of breath and wheezing.   Cardiovascular:  Negative for chest pain, palpitations  and leg swelling.  Gastrointestinal:  Negative for abdominal pain, constipation, diarrhea, heartburn, nausea and vomiting.  Genitourinary:  Negative for dysuria, frequency and urgency.  Musculoskeletal:  Negative for myalgias.  Skin:  Negative for rash.  Neurological:  Negative for dizziness, seizures, weakness and headaches.  Psychiatric/Behavioral:  Negative for depression, substance abuse and suicidal ideas. The patient is nervous/anxious.      Current Outpatient Medications:    albuterol (VENTOLIN HFA) 108 (90 Base) MCG/ACT inhaler, Inhale 2 puffs into the lungs every 6 (six) hours as needed for wheezing or shortness of breath., Disp: 54 g, Rfl: 3   busPIRone (BUSPAR) 10 MG tablet, Take 1 tablet (10 mg total) by mouth daily., Disp: 90 tablet, Rfl: 1   cetirizine (ZYRTEC) 10 MG tablet, Take 1 tablet (10 mg total) by mouth daily., Disp: 30 tablet, Rfl: 11   Fluticasone Propionate, Inhal, (FLOVENT DISKUS) 100 MCG/ACT AEPB, Inhale 1 puff into the lungs in the morning and at bedtime., Disp: 90 each, Rfl: 3   hydrocortisone 1 % lotion, Apply 1 application. topically 2 (two) times daily., Disp: 118 mL, Rfl: 0   norethindrone-ethinyl estradiol (LOESTRIN) 1-20 MG-MCG tablet, TAKE 1 TABLET BY MOUTH DAILY, Disp: 84 tablet, Rfl: 0   triamcinolone cream (KENALOG) 0.1 %, Apply 1 Application topically 2 (two) times daily., Disp: 454 g, Rfl: 3  Allergies  Allergen Reactions   Penicillins Hives    Past Medical History:  Diagnosis Date   Allergy    Anxiety    Asthma    Auditory processing disorder    Hyperacusis    Memory difficulty    tolerance fading memory per mother   Raynaud disease     History reviewed. No pertinent surgical history.  Family History  Problem Relation Age of Onset   Raynaud syndrome Mother    Asthma Mother    Arthritis Mother        Psoriatic or RA   Asthma Father    Raynaud syndrome Sister    Vitiligo Brother    Arthritis Maternal Grandmother    Thyroid  disease Maternal Grandfather    Rheum arthritis Maternal Grandfather    Heart attack Maternal Grandfather    Heart disease Maternal Grandfather    Pancreatic cancer Paternal Grandmother    Diabetes Paternal Grandmother    Prostate cancer Paternal Grandfather    Diabetes Paternal Grandfather    Breast cancer Other    Diabetes Other    Cancer Other        Unknown type   Diabetes Other    Diabetes Maternal Aunt     Social History   Socioeconomic History   Marital status: Single    Spouse name: Not on file   Number of children: Not on file   Years of education: Not on file   Highest education level: Not on file  Occupational History   Not on file  Tobacco Use   Smoking status: Never    Passive exposure: Never   Smokeless tobacco: Never  Vaping Use   Vaping Use: Never used  Substance and Sexual Activity   Alcohol use: Never   Drug use: Never   Sexual activity: Yes    Birth control/protection: Pill  Other Topics Concern   Not on file  Social History Narrative   Not on file   Social Determinants of Health   Financial Resource Strain: Not on file  Food Insecurity: Not on file  Transportation Needs: Not on file  Physical Activity: Not on file  Stress: Not on file  Social Connections: Not on file  Intimate Partner Violence: Not on file      Objective:    BP 114/72   Pulse 88   Temp 98 F (36.7 C)   Resp 20   Ht _0  (1.626 m)   Wt 110 lb (49.9 kg)   LMP 04/07/2022 (Approximate)   SpO2 99%   BMI 18.88 kg/m   BP Readings from Last 3 Encounters:  05/26/22 114/72  04/22/22 122/84  02/06/22 125/81   Wt Readings from Last 3 Encounters:  05/26/22 110 lb (49.9 kg)  04/22/22 108 lb 3.2 oz (49.1 kg)  02/06/22 108 lb (49 kg)    Physical Exam Vitals reviewed. Exam conducted with a chaperone present (J. Bullins).  Constitutional:      General: She is not in acute distress.    Appearance: Normal appearance. She is underweight. She is not ill-appearing,  toxic-appearing or diaphoretic.  HENT:     Head: Normocephalic and atraumatic.     Right Ear: Tympanic membrane, ear canal and external ear normal. There is no impacted cerumen.     Left Ear: Tympanic membrane, ear canal and external ear normal. There is no impacted cerumen.     Nose: Nose normal. No congestion or rhinorrhea.     Mouth/Throat:     Mouth: Mucous membranes are moist.     Pharynx: Oropharynx is clear. No oropharyngeal exudate or posterior oropharyngeal erythema.  Eyes:     General: No scleral icterus.       Right eye: No discharge.        Left eye: No discharge.     Conjunctiva/sclera: Conjunctivae normal.     Pupils: Pupils are equal, round, and reactive to light.  Cardiovascular:     Rate and Rhythm: Normal rate and regular rhythm.     Heart sounds: Normal heart sounds. No murmur heard.    No friction rub. No gallop.  Pulmonary:     Effort: Pulmonary effort is normal. No respiratory distress.     Breath sounds: Normal breath sounds. No stridor. No wheezing, rhonchi or rales.  Abdominal:     General: Abdomen is flat. Bowel sounds are normal. There is no distension.     Palpations: Abdomen is soft. There is no hepatomegaly, splenomegaly or mass.     Tenderness: There is no abdominal tenderness. There is no guarding or rebound.     Hernia: No hernia is present. There is no hernia in the left inguinal area or right inguinal area.  Genitourinary:    Pubic Area: No rash or pubic lice.      Labia:        Right: No rash, tenderness, lesion or injury.        Left: No rash, tenderness, lesion or injury.      Vagina:  Normal.     Cervix: Discharge, friability and erythema present. No cervical motion tenderness, lesion, cervical bleeding or eversion.     Uterus: Normal.      Adnexa: Right adnexa normal and left adnexa normal.  Musculoskeletal:        General: Normal range of motion.     Cervical back: Normal range of motion and neck supple. No rigidity. No muscular  tenderness.  Lymphadenopathy:     Cervical: No cervical adenopathy.     Lower Body: No right inguinal adenopathy. No left inguinal adenopathy.  Skin:    General: Skin is warm and dry.     Capillary Refill: Capillary refill takes less than 2 seconds.  Neurological:     General: No focal deficit present.     Mental Status: She is alert and oriented to person, place, and time. Mental status is at baseline.  Psychiatric:        Mood and Affect: Mood normal.        Behavior: Behavior normal.        Thought Content: Thought content normal.        Judgment: Judgment normal.     Lab Results  Component Value Date   TSH 2.590 03/06/2021   Lab Results  Component Value Date   WBC 4.9 04/22/2022   HGB 14.2 04/22/2022   HCT 42.6 04/22/2022   MCV 91 04/22/2022   PLT 356 03/06/2021   Lab Results  Component Value Date   NA 139 04/22/2022   K 4.3 04/22/2022   CO2 23 04/22/2022   GLUCOSE 84 04/22/2022   BUN 8 04/22/2022   CREATININE 0.93 04/22/2022   BILITOT <0.2 04/22/2022   ALKPHOS 36 (L) 04/22/2022   AST 16 04/22/2022   ALT 10 04/22/2022   PROT 7.2 04/22/2022   ALBUMIN 5.0 04/22/2022   CALCIUM 9.5 04/22/2022   EGFR 90 04/22/2022   No results found for: "CHOL" No results found for: "HDL" No results found for: "LDLCALC" No results found for: "TRIG" No results found for: "CHOLHDL" No results found for: "HGBA1C"

## 2022-05-28 LAB — CYTOLOGY - PAP
Adequacy: ABSENT
Chlamydia: NEGATIVE
Comment: NEGATIVE
Comment: NEGATIVE
Comment: NORMAL
Diagnosis: NEGATIVE
Neisseria Gonorrhea: NEGATIVE
Trichomonas: NEGATIVE

## 2022-05-31 ENCOUNTER — Other Ambulatory Visit: Payer: Self-pay | Admitting: Family Medicine

## 2022-05-31 DIAGNOSIS — Z30011 Encounter for initial prescription of contraceptive pills: Secondary | ICD-10-CM

## 2022-08-12 ENCOUNTER — Other Ambulatory Visit: Payer: Self-pay | Admitting: Family Medicine

## 2022-08-12 DIAGNOSIS — F411 Generalized anxiety disorder: Secondary | ICD-10-CM

## 2023-05-11 ENCOUNTER — Telehealth: Payer: Self-pay | Admitting: Family Medicine

## 2023-05-11 NOTE — Telephone Encounter (Signed)
You can add her but make her aware that the wait for new pt appt is booking out until feb. If she is ok with that ok to schedule in 2025. Otherwise, would encourage her to est care with one of the NPs.

## 2023-09-17 ENCOUNTER — Encounter: Payer: Self-pay | Admitting: Family Medicine

## 2023-09-17 ENCOUNTER — Ambulatory Visit (INDEPENDENT_AMBULATORY_CARE_PROVIDER_SITE_OTHER): Admitting: Family Medicine

## 2023-09-17 VITALS — BP 107/67 | HR 100 | Temp 98.3°F | Ht 64.0 in | Wt 116.6 lb

## 2023-09-17 DIAGNOSIS — B359 Dermatophytosis, unspecified: Secondary | ICD-10-CM

## 2023-09-17 DIAGNOSIS — F411 Generalized anxiety disorder: Secondary | ICD-10-CM | POA: Diagnosis not present

## 2023-09-17 DIAGNOSIS — J4540 Moderate persistent asthma, uncomplicated: Secondary | ICD-10-CM | POA: Diagnosis not present

## 2023-09-17 MED ORDER — ALBUTEROL SULFATE HFA 108 (90 BASE) MCG/ACT IN AERS: 2.0000 | INHALATION_SPRAY | Freq: Four times a day (QID) | RESPIRATORY_TRACT | 1 refills | Status: DC | PRN

## 2023-09-17 MED ORDER — FLUCONAZOLE 150 MG PO TABS: 150.0000 mg | ORAL_TABLET | ORAL | 0 refills | Status: DC

## 2023-09-17 MED ORDER — BUSPIRONE HCL 10 MG PO TABS: 10.0000 mg | ORAL_TABLET | Freq: Three times a day (TID) | ORAL | 0 refills | Status: DC

## 2023-09-17 NOTE — Progress Notes (Signed)
 Subjective: CC: Anxiety disorder PCP: Colleen Norene HERO, DO [name] is a 23 y.o. female presenting to clinic today for:  1.  Generalized anxiety disorder Patient here to establish care with me.  Her PCP has left the practice.  She notes that she has been utilizing BuSpar  just once daily.  She last saw her PCP back in April 2023 at that time reported that the BuSpar  was not totally eliminating the sensation of feeling she was going get a panic attack but she was avoiding an actual panic attack and therefore she was continued on buspirone .  She has had previous failures of several SSRIs including Lexapro , Prozac  and Zoloft .  SSRIs caused her to be too drowsy.  There were plans for transition over to SNRI if she was having any breakthrough symptoms.  She is not quite sure how she ended up on once daily treatment as the initial prescription indicated she could take medication 3 times daily.  She does report to me today that the daily regimen is not totally controlling her symptomology.  She would like to try 3 times daily dosing again  2.  Rash Patient reports that she has suffered from an itchy rash along her trunk and upper extremities for several years now.  This is initially thought to be a contact dermatitis and she was placed on triamcinolone  cream.  This does seem to help with itch but it does not resolve the rash.   ROS: Per HPI  Allergies  Allergen Reactions   Penicillins Hives   Past Medical History:  Diagnosis Date   Allergy    Anxiety    Asthma    Auditory processing disorder    Hyperacusis    Memory difficulty    tolerance fading memory per mother   Raynaud disease     Current Outpatient Medications:    albuterol  (VENTOLIN  HFA) 108 (90 Base) MCG/ACT inhaler, Inhale 2 puffs into the lungs every 6 (six) hours as needed for wheezing or shortness of breath., Disp: 54 g, Rfl: 3   busPIRone  (BUSPAR ) 10 MG tablet, Take 1 tablet (10 mg total) by mouth daily. (NEEDS TO BE  SEEN BEFORE NEXT REFILL), Disp: 30 tablet, Rfl: 0   cetirizine  (ZYRTEC ) 10 MG tablet, Take 1 tablet (10 mg total) by mouth daily., Disp: 30 tablet, Rfl: 11   hydrocortisone  1 % lotion, Apply 1 application. topically 2 (two) times daily., Disp: 118 mL, Rfl: 0   triamcinolone  cream (KENALOG ) 0.1 %, Apply 1 Application topically 2 (two) times daily., Disp: 454 g, Rfl: 3   Fluticasone Propionate , Inhal, (FLOVENT  DISKUS) 100 MCG/ACT AEPB, Inhale 1 puff into the lungs in the morning and at bedtime., Disp: 90 each, Rfl: 3 Social History   Socioeconomic History   Marital status: Single    Spouse name: Not on file   Number of children: Not on file   Years of education: Not on file   Highest education level: Not on file  Occupational History   Not on file  Tobacco Use   Smoking status: Never    Passive exposure: Never   Smokeless tobacco: Never  Vaping Use   Vaping status: Never Used  Substance and Sexual Activity   Alcohol use: Never   Drug use: Never   Sexual activity: Yes    Birth control/protection: Pill  Other Topics Concern   Not on file  Social History Narrative   Not on file   Social Drivers of Health   Financial Resource Strain:  Not on file  Food Insecurity: Not on file  Transportation Needs: Not on file  Physical Activity: Not on file  Stress: Not on file  Social Connections: Not on file  Intimate Partner Violence: Not on file   Family History  Problem Relation Age of Onset   Raynaud syndrome Mother    Asthma Mother    Arthritis Mother        Psoriatic or RA   Asthma Father    Raynaud syndrome Sister    Vitiligo Brother    Arthritis Maternal Grandmother    Thyroid  disease Maternal Grandfather    Rheum arthritis Maternal Grandfather    Heart attack Maternal Grandfather    Heart disease Maternal Grandfather    Pancreatic cancer Paternal Grandmother    Diabetes Paternal Grandmother    Prostate cancer Paternal Grandfather    Diabetes Paternal Grandfather     Breast cancer Other    Diabetes Other    Cancer Other        Unknown type   Diabetes Other    Diabetes Maternal Aunt     Objective: Office vital signs reviewed. BP 107/67   Pulse 100   Temp 98.3 F (36.8 C)   Ht 5' 4 (1.626 m)   Wt 116 lb 9.6 oz (52.9 kg)   SpO2 100%   BMI 20.01 kg/m   Physical Examination:  General: Awake, alert, well nourished, No acute distress Skin: Several annular areas of hypopigmentation along the dorsal forearms.  She has mild hyperemia with central clearing noted along the trunk Psych: Mood stable, speech normal, very pleasant and interactive     09/17/2023   10:30 AM 05/26/2022    1:24 PM 02/06/2022   10:40 AM  Depression screen PHQ 2/9  Decreased Interest 0 0 0  Down, Depressed, Hopeless 1 0 0  PHQ - 2 Score 1 0 0  Altered sleeping 0 1 1  Tired, decreased energy 1 1 0  Change in appetite 0 0 0  Feeling bad or failure about yourself  0 0 0  Trouble concentrating 0 1 0  Moving slowly or fidgety/restless 0 0 0  Suicidal thoughts 0 0 0  PHQ-9 Score 2 3 1   Difficult doing work/chores Somewhat difficult Not difficult at all Not difficult at all      09/17/2023   10:29 AM 05/26/2022    1:24 PM 12/09/2021    3:48 PM 11/05/2021    3:30 PM  GAD 7 : Generalized Anxiety Score  Nervous, Anxious, on Edge 1 1 1 1   Control/stop worrying 0 1 0 0  Worry too much - different things 0 1 1 1   Trouble relaxing 1 0 0 0  Restless 1 0 0 0  Easily annoyed or irritable 0 0 0 0  Afraid - awful might happen 0 0 0 0  Total GAD 7 Score 3 3 2 2   Anxiety Difficulty Somewhat difficult Not difficult at all Not difficult at all Not difficult at all    Assessment/ Plan: 23 y.o. female   Generalized anxiety disorder - Plan: busPIRone  (BUSPAR ) 10 MG tablet  Tinea - Plan: fluconazole  (DIFLUCAN ) 150 MG tablet  Moderate persistent asthma without complication - Plan: albuterol  (VENTOLIN  HFA) 108 (90 Base) MCG/ACT inhaler  I think 3 times daily dosing is totally  reasonable.  I have advanced her to 10 mg 3 times daily.  We will plan to advance further if needed versus transition over to SNRI like Pristiq  as previously planned.  I suspect that the rash that she has been experiencing over the last couple years in fact a tinea rash.  She has several areas of hypopigmentation along the arms.  Symptomology has been refractory to treatment for contact dermatitis.  Will trial her on 4 weeks of Diflucan  to see if we can get some resolution.  If not, neck step will be plan for dermatology  Asthma was chronic and stable with Flovent .  Ventolin  sent for as needed use  She will reconvene with me virtually in about 3 to 4 weeks, sooner if concerns arise   Colleen Mattie CHRISTELLA Fielding, DO [facility] [phone]

## 2023-10-18 ENCOUNTER — Encounter: Payer: Self-pay | Admitting: Family Medicine

## 2023-10-18 ENCOUNTER — Telehealth (INDEPENDENT_AMBULATORY_CARE_PROVIDER_SITE_OTHER): Admitting: Family Medicine

## 2023-10-18 DIAGNOSIS — B359 Dermatophytosis, unspecified: Secondary | ICD-10-CM

## 2023-10-18 DIAGNOSIS — F411 Generalized anxiety disorder: Secondary | ICD-10-CM

## 2023-10-18 MED ORDER — FLUCONAZOLE 150 MG PO TABS: 150.0000 mg | ORAL_TABLET | ORAL | 0 refills | Status: AC

## 2023-10-18 NOTE — Progress Notes (Signed)
 MyChart Video visit  Subjective: CC: Anxiety follow-up PCP: Eliodoro Guerin, DO Colleen Mason is a 23 y.o. female. Patient provides verbal consent for consult held via video.  Due to COVID-19 pandemic this visit was conducted virtually. This visit type was conducted due to national recommendations for restrictions regarding the COVID-19 Pandemic (e.g. social distancing, sheltering in place) in an effort to limit this patient's exposure and mitigate transmission in our community. All issues noted in this document were discussed and addressed.  A physical exam was not performed with this format.   Location of patient: home Location of provider: [facility] Others present for call: none  1.  Anxiety disorder Patient reports that over the last couple of weeks she has been relatively okay.  She does get a little bit of weird feelings after taking the BuSpar  for about 30 minutes or so and then it resolves.  She is taking it 3 times a day successfully.  She still has some breakthrough anxiety at work but otherwise anxiety seems to be totally situational and better than it was before.  Want to stick with current plan for now  2.  Fungal infection She reports great improvement in the rash after utilization of 4 weeks of Diflucan .  It is almost totally resolved except for a small patch on one of her arms.   ROS: Per HPI  Allergies  Allergen Reactions   Penicillins Hives   Past Medical History:  Diagnosis Date   Allergy    Anxiety    Asthma    Auditory processing disorder    Hyperacusis    Memory difficulty    tolerance fading memory per mother   Raynaud disease     Current Outpatient Medications:    albuterol  (VENTOLIN  HFA) 108 (90 Base) MCG/ACT inhaler, Inhale 2 puffs into the lungs every 6 (six) hours as needed for wheezing or shortness of breath., Disp: 18 g, Rfl: 1   busPIRone  (BUSPAR ) 10 MG tablet, Take 1 tablet (10 mg total) by mouth 3 (three) times daily., Disp: 270 tablet, Rfl:  0   cetirizine  (ZYRTEC ) 10 MG tablet, Take 1 tablet (10 mg total) by mouth daily., Disp: 30 tablet, Rfl: 11   fluconazole  (DIFLUCAN ) 150 MG tablet, Take 1 tablet (150 mg total) by mouth every 7 (seven) days., Disp: 4 tablet, Rfl: 0   Fluticasone Propionate , Inhal, (FLOVENT  DISKUS) 100 MCG/ACT AEPB, Inhale 1 puff into the lungs in the morning and at bedtime., Disp: 90 each, Rfl: 3   hydrocortisone  1 % lotion, Apply 1 application. topically 2 (two) times daily., Disp: 118 mL, Rfl: 0   triamcinolone  cream (KENALOG ) 0.1 %, Apply 1 Application topically 2 (two) times daily., Disp: 454 g, Rfl: 3  Gen: Well-appearing female in no acute distress     10/18/2023    3:51 PM 09/17/2023   10:30 AM 05/26/2022    1:24 PM  Depression screen PHQ 2/9  Decreased Interest 0 0 0  Down, Depressed, Hopeless 0 1 0  PHQ - 2 Score 0 1 0  Altered sleeping  0 1  Tired, decreased energy  1 1  Change in appetite  0 0  Feeling bad or failure about yourself   0 0  Trouble concentrating  0 1  Moving slowly or fidgety/restless  0 0  Suicidal thoughts  0 0  PHQ-9 Score  2 3  Difficult doing work/chores Not difficult at all Somewhat difficult Not difficult at all      10/18/2023  3:51 PM 09/17/2023   10:29 AM 05/26/2022    1:24 PM 12/09/2021    3:48 PM  GAD 7 : Generalized Anxiety Score  Nervous, Anxious, on Edge 3 1 1 1   Control/stop worrying 1 0 1 0  Worry too much - different things 0 0 1 1  Trouble relaxing 1 1 0 0  Restless 0 1 0 0  Easily annoyed or irritable 1 0 0 0  Afraid - awful might happen 0 0 0 0  Total GAD 7 Score 6 3 3 2   Anxiety Difficulty Somewhat difficult Somewhat difficult Not difficult at all Not difficult at all    Assessment/ Plan: 23 y.o. female   Tinea - Plan: fluconazole  (DIFLUCAN ) 150 MG tablet  Generalized anxiety disorder  2 more weeks of Diflucan .  Continue BuSpar  for now.  I think that this slight elevation in score compared to last time is situational due to work issues.   She will message me in the next couple of weeks to determine if she wants to continue BuSpar  or if she would like to switch gears and turn to Pristiq  instead.  Start time: 3:47pm End time: 3:53pm  Total time spent on patient care (including video visit/ documentation): 8 minutes  Wendi Lastra Bambi Bonine, DO [facility] [phone]

## 2023-10-27 ENCOUNTER — Encounter: Payer: Self-pay | Admitting: Family Medicine

## 2023-10-29 ENCOUNTER — Other Ambulatory Visit: Payer: Self-pay | Admitting: Family Medicine

## 2023-10-29 DIAGNOSIS — F411 Generalized anxiety disorder: Secondary | ICD-10-CM

## 2023-10-29 MED ORDER — DESVENLAFAXINE SUCCINATE ER 50 MG PO TB24: 50.0000 mg | ORAL_TABLET | Freq: Every day | ORAL | 0 refills | Status: DC

## 2023-11-08 ENCOUNTER — Other Ambulatory Visit: Payer: Self-pay | Admitting: Family Medicine

## 2023-11-08 DIAGNOSIS — J4540 Moderate persistent asthma, uncomplicated: Secondary | ICD-10-CM

## 2023-12-03 ENCOUNTER — Encounter: Payer: Self-pay | Admitting: Family Medicine

## 2023-12-03 DIAGNOSIS — R21 Rash and other nonspecific skin eruption: Secondary | ICD-10-CM

## 2024-01-24 ENCOUNTER — Other Ambulatory Visit: Payer: Self-pay | Admitting: Family Medicine

## 2024-01-24 DIAGNOSIS — F411 Generalized anxiety disorder: Secondary | ICD-10-CM

## 2024-01-26 ENCOUNTER — Encounter: Admitting: Family Medicine

## 2024-02-09 ENCOUNTER — Telehealth: Payer: Self-pay

## 2024-02-09 ENCOUNTER — Encounter: Payer: Self-pay | Admitting: Family Medicine

## 2024-02-09 DIAGNOSIS — F411 Generalized anxiety disorder: Secondary | ICD-10-CM

## 2024-02-09 MED ORDER — DESVENLAFAXINE SUCCINATE ER 50 MG PO TB24: 50.0000 mg | ORAL_TABLET | Freq: Every day | ORAL | 0 refills | Status: DC

## 2024-02-09 MED ORDER — DESVENLAFAXINE SUCCINATE ER 50 MG PO TB24
50.0000 mg | ORAL_TABLET | Freq: Every day | ORAL | 1 refills | Status: DC
Start: 2024-02-09 — End: 2024-04-07

## 2024-02-09 NOTE — Telephone Encounter (Signed)
 Copied from CRM [id number]. Topic: Clinical - Medication Question >> Feb 09, 2024  8:43 AM Lizabeth Riggs wrote: Reason for CRM:  The first available appointment is August 1st and she is out of desvenlafaxine  (PRISTIQ ) 50 MG 24 hr tablet. She wants to know is Dr. Bonnell Butcher could call her in a refill to give her enough medication until appointment date. Please call or send a message through MyChart if the can be done or cannot be done. Thanks

## 2024-02-09 NOTE — Telephone Encounter (Signed)
 Pt notified via detalied message on vm. LS

## 2024-02-09 NOTE — Telephone Encounter (Signed)
 Yes of course. Med sent

## 2024-02-09 NOTE — Addendum Note (Signed)
 Addended by: Eliodoro Guerin on: 02/09/2024 10:52 AM   Modules accepted: Orders

## 2024-03-28 ENCOUNTER — Telehealth: Payer: Self-pay | Admitting: Family Medicine

## 2024-03-28 NOTE — Telephone Encounter (Signed)
Pt notified.    LS

## 2024-03-28 NOTE — Telephone Encounter (Signed)
 Yes of course. Please just make her aware I will call during lunch instead of scheduled time. Also, please make sure we have the number she wants me to call in the comments section of the appt

## 2024-03-28 NOTE — Telephone Encounter (Signed)
 Pt wants to know if it is ok to switch 04/07/2024 in person to video visit.

## 2024-04-07 ENCOUNTER — Encounter: Payer: Self-pay | Admitting: Family Medicine

## 2024-04-07 ENCOUNTER — Telehealth: Admitting: Family Medicine

## 2024-04-07 ENCOUNTER — Ambulatory Visit: Admitting: Family Medicine

## 2024-04-07 DIAGNOSIS — F411 Generalized anxiety disorder: Secondary | ICD-10-CM

## 2024-04-07 MED ORDER — DESVENLAFAXINE SUCCINATE ER 50 MG PO TB24: 50.0000 mg | ORAL_TABLET | Freq: Every day | ORAL | 4 refills | Status: DC

## 2024-04-07 NOTE — Progress Notes (Signed)
 MyChart Video visit  Subjective: CC: Follow-up anxiety disorder PCP: Jolinda Norene HERO, DO YEP:Jwwj Borello is a 23 y.o. female. Patient provides verbal consent for consult held via video.  Due to COVID-19 pandemic this visit was conducted virtually. This visit type was conducted due to national recommendations for restrictions regarding the COVID-19 Pandemic (e.g. social distancing, sheltering in place) in an effort to limit this patient's exposure and mitigate transmission in our community. All issues noted in this document were discussed and addressed.  A physical exam was not performed with this format.   Location of patient: car Location of provider: [facility] Others present for call: family member  1.  Generalized anxiety sore Patient reports that symptoms have been stable with Pristiq  50 mg daily.  She just moved back to town from [place] and needs to establish with a new therapist that takes [facility].  Asking for assistance with this. LMP: 03/31/2024  ROS: Per HPI  Allergies  Allergen Reactions   Penicillins Hives   Past Medical History:  Diagnosis Date   Allergy    Anxiety    Asthma    Auditory processing disorder    Hyperacusis    Memory difficulty    tolerance fading memory per mother   Raynaud disease     Current Outpatient Medications:    albuterol  (VENTOLIN  HFA) 108 (90 Base) MCG/ACT inhaler, INHALE 2 PUFFS INTO THE LUNGS EVERY 6 HOURS AS NEEDED FOR WHEEZING OR SHORTNESS OF BREATH, Disp: 18 g, Rfl: 4   cetirizine  (ZYRTEC ) 10 MG tablet, Take 1 tablet (10 mg total) by mouth daily., Disp: 30 tablet, Rfl: 11   desvenlafaxine  (PRISTIQ ) 50 MG 24 hr tablet, Take 1 tablet (50 mg total) by mouth daily., Disp: 90 tablet, Rfl: 1   Fluticasone Propionate , Inhal, (FLOVENT  DISKUS) 100 MCG/ACT AEPB, Inhale 1 puff into the lungs in the morning and at bedtime., Disp: 90 each, Rfl: 3   hydrocortisone  1 % lotion, Apply 1 application. topically 2 (two) times daily., Disp: 118 mL, Rfl:  0   triamcinolone  cream (KENALOG ) 0.1 %, Apply 1 Application topically 2 (two) times daily., Disp: 454 g, Rfl: 3  Gen: Well-appearing female in no acute distress. Psych: Mood stable, speech normal, affect appropriate     04/07/2024   12:30 PM 10/18/2023    3:51 PM 09/17/2023   10:30 AM  Depression screen PHQ 2/9  Decreased Interest 0 0 0  Down, Depressed, Hopeless 1 0 1  PHQ - 2 Score 1 0 1  Altered sleeping   0  Tired, decreased energy   1  Change in appetite   0  Feeling bad or failure about yourself    0  Trouble concentrating   0  Moving slowly or fidgety/restless   0  Suicidal thoughts   0  PHQ-9 Score   2  Difficult doing work/chores  Not difficult at all Somewhat difficult      04/07/2024   12:30 PM 10/18/2023    3:51 PM 09/17/2023   10:29 AM 05/26/2022    1:24 PM  GAD 7 : Generalized Anxiety Score  Nervous, Anxious, on Edge 1 3 1 1   Control/stop worrying 1 1 0 1  Worry too much - different things 1 0 0 1  Trouble relaxing 0 1 1 0  Restless 0 0 1 0  Easily annoyed or irritable 1 1 0 0  Afraid - awful might happen 0 0 0 0  Total GAD 7 Score 4 6 3 3   Anxiety  Difficulty Not difficult at all Somewhat difficult Somewhat difficult Not difficult at all   Assessment/ Plan: 23 y.o. female   Generalized anxiety disorder - Plan: desvenlafaxine  (PRISTIQ ) 50 MG 24 hr tablet, Ambulatory referral to Psychology  Referral for therapy locally.  Pristiq  renewed.  Physical scheduled.  She will follow-up as needed in between  Start time: 12:30pm End time: 12:37pm  Total time spent on patient care (including video visit/ documentation): 9 minutes  Deleon Passe CHRISTELLA Fielding, DO [facility] [phone]

## 2024-04-11 ENCOUNTER — Encounter: Payer: Self-pay | Admitting: Family Medicine

## 2024-05-06 ENCOUNTER — Other Ambulatory Visit: Payer: Self-pay | Admitting: Family Medicine

## 2024-05-06 DIAGNOSIS — F411 Generalized anxiety disorder: Secondary | ICD-10-CM

## 2024-05-19 ENCOUNTER — Encounter: Payer: Self-pay | Admitting: Family Medicine

## 2024-05-19 ENCOUNTER — Other Ambulatory Visit: Payer: Self-pay | Admitting: Family Medicine

## 2024-05-19 DIAGNOSIS — F411 Generalized anxiety disorder: Secondary | ICD-10-CM

## 2024-05-19 NOTE — Telephone Encounter (Signed)
 Copied from CRM [id number]. Topic: Clinical - Medication Refill >> May 19, 2024  4:40 PM Kevelyn M wrote: Medication: desvenlafaxine  (PRISTIQ ) 50 MG 24 hr tablet  Has the patient contacted their pharmacy? Yes (Agent: If no, request that the patient contact the pharmacy for the refill. If patient does not wish to contact the pharmacy document the reason why and proceed with request.) (Agent: If yes, when and what did the pharmacy advise?)  This is the patient's preferred pharmacy:  [facility] [address] [place], [place], [place] [address] #[phone]  Is this the correct pharmacy for this prescription? Yes If no, delete pharmacy and type the correct one.   Has the prescription been filled recently? No  Is the patient out of the medication? Yes  Has the patient been seen for an appointment in the last year OR does the patient have an upcoming appointment? Yes  Can we respond through MyChart? No  Agent: Please be advised that Rx refills may take up to 3 business days. We ask that you follow-up with your pharmacy.

## 2024-05-19 NOTE — Telephone Encounter (Unsigned)
 Copied from CRM #[id number]. Topic: Clinical - Prescription Issue >> May 19, 2024  4:24 PM Delon T wrote: Reason for CRM: desvenlafaxine  (PRISTIQ ) 50 MG 24 hr tablet- pharmacy said it is too soon to fill, out of medication now- [phone]

## 2024-05-22 MED ORDER — DESVENLAFAXINE SUCCINATE ER 50 MG PO TB24: 50.0000 mg | ORAL_TABLET | Freq: Every day | ORAL | 4 refills | Status: DC

## 2024-05-22 MED ORDER — DESVENLAFAXINE SUCCINATE ER 50 MG PO TB24
50.0000 mg | ORAL_TABLET | Freq: Every day | ORAL | 4 refills | Status: DC
Start: 2024-05-22 — End: 2024-05-22

## 2024-05-30 ENCOUNTER — Encounter: Payer: Self-pay | Admitting: Family Medicine

## 2024-05-30 ENCOUNTER — Ambulatory Visit: Admitting: Family Medicine

## 2024-05-30 ENCOUNTER — Other Ambulatory Visit (HOSPITAL_COMMUNITY)
Admission: RE | Admit: 2024-05-30 | Discharge: 2024-05-30 | Disposition: A | Discharge status: Home / Self Care | Payer: Self-pay | Source: Ambulatory Visit | Attending: Family Medicine | Admitting: Family Medicine

## 2024-05-30 ENCOUNTER — Ambulatory Visit: Payer: Self-pay | Admitting: Family Medicine

## 2024-05-30 VITALS — BP 115/77 | HR 75 | Temp 97.6°F | Ht 64.0 in | Wt 107.0 lb

## 2024-05-30 DIAGNOSIS — Z7251 High risk heterosexual behavior: Secondary | ICD-10-CM | POA: Insufficient documentation

## 2024-05-30 LAB — WET PREP FOR TRICH, YEAST, CLUE
Clue Cell Exam: NEGATIVE
Trichomonas Exam: NEGATIVE
Yeast Exam: NEGATIVE

## 2024-05-30 NOTE — Progress Notes (Signed)
 Subjective:  Patient ID: Colleen Mason, female    DOB: [birth year], 23 y.o.   MRN: [id number]  Patient Care Team: Jolinda Norene HERO, DO as PCP - General (Family Medicine)   Chief Complaint:  std check    HPI: Colleen Mason is a 23 y.o. female presenting on 05/30/2024 for std check    A 23 year old female who presents for an STD check due to concerns about potential exposure from her ex-husband.  She is concerned about a possible odor in the genital area, which she is unsure if it is normal. There is no discharge or sores. She denies oral symptoms.   She last had unprotected vaginal intercourse with her ex-husband, who has been out of the country for a while. She is unsure of the exact date of the last encounter.  The patient reports that her last menstrual cycle ended yesterday.    Relevant past medical, surgical, family, and social history reviewed and updated as indicated.  Allergies and medications reviewed and updated. Data reviewed: Chart in Epic.   Past Medical History:  Diagnosis Date   Allergy    Anxiety    Asthma    Auditory processing disorder    Hyperacusis    Memory difficulty    tolerance fading memory per mother   Raynaud disease     History reviewed. No pertinent surgical history.  Social History   Socioeconomic History   Marital status: Single    Spouse name: Not on file   Number of children: Not on file   Years of education: Not on file   Highest education level: Not on file  Occupational History   Not on file  Tobacco Use   Smoking status: Never    Passive exposure: Never   Smokeless tobacco: Never  Vaping Use   Vaping status: Never Used  Substance and Sexual Activity   Alcohol use: Never   Drug use: Never   Sexual activity: Yes    Birth control/protection: Pill  Other Topics Concern   Not on file  Social History Narrative   Not on file   Social Drivers of Health   Financial Resource Strain: Not on file  Food Insecurity: Not on  file  Transportation Needs: Not on file  Physical Activity: Not on file  Stress: Not on file  Social Connections: Not on file  Intimate Partner Violence: Not on file    Outpatient Encounter Medications as of 05/30/2024  Medication Sig   albuterol  (VENTOLIN  HFA) 108 (90 Base) MCG/ACT inhaler INHALE 2 PUFFS INTO THE LUNGS EVERY 6 HOURS AS NEEDED FOR WHEEZING OR SHORTNESS OF BREATH   cetirizine  (ZYRTEC ) 10 MG tablet Take 1 tablet (10 mg total) by mouth daily.   desvenlafaxine  (PRISTIQ ) 50 MG 24 hr tablet Take 1 tablet (50 mg total) by mouth daily.   Fluticasone Propionate , Inhal, (FLOVENT  DISKUS) 100 MCG/ACT AEPB Inhale 1 puff into the lungs in the morning and at bedtime.   [DISCONTINUED] hydrocortisone  1 % lotion Apply 1 application. topically 2 (two) times daily.   [DISCONTINUED] triamcinolone  cream (KENALOG ) 0.1 % Apply 1 Application topically 2 (two) times daily.   No facility-administered encounter medications on file as of 05/30/2024.    Allergies  Allergen Reactions   Penicillins Hives    Pertinent ROS per HPI, otherwise unremarkable      Objective:  BP 115/77   Pulse 75   Temp 97.6 F (36.4 C)   Ht 5' 4 (1.626 m)  Wt 107 lb (48.5 kg)   LMP 05/23/2024   SpO2 99%   BMI 18.37 kg/m    Wt Readings from Last 3 Encounters:  05/30/24 107 lb (48.5 kg)  09/17/23 116 lb 9.6 oz (52.9 kg)  05/26/22 110 lb (49.9 kg)    Physical Exam Vitals and nursing note reviewed.  Constitutional:      General: She is not in acute distress.    Appearance: Normal appearance. She is normal weight. She is not ill-appearing, toxic-appearing or diaphoretic.  HENT:     Head: Normocephalic and atraumatic.     Nose: Nose normal.     Mouth/Throat:     Mouth: Mucous membranes are moist.     Pharynx: Oropharynx is clear. No oropharyngeal exudate or posterior oropharyngeal erythema.  Eyes:     Conjunctiva/sclera: Conjunctivae normal.     Pupils: Pupils are equal, round, and reactive to light.   Cardiovascular:     Rate and Rhythm: Normal rate and regular rhythm.     Heart sounds: Normal heart sounds.  Pulmonary:     Effort: Pulmonary effort is normal.     Breath sounds: Normal breath sounds.  Musculoskeletal:     Cervical back: Neck supple.     Right lower leg: No edema.     Left lower leg: No edema.  Skin:    General: Skin is warm and dry.     Capillary Refill: Capillary refill takes less than 2 seconds.  Neurological:     General: No focal deficit present.     Mental Status: She is alert and oriented to person, place, and time.  Psychiatric:        Mood and Affect: Mood normal.        Behavior: Behavior normal.        Thought Content: Thought content normal.        Judgment: Judgment normal.     Results for orders placed or performed in visit on 05/26/22  Cytology - PAP   Collection Time: 05/26/22  2:07 PM  Result Value Ref Range   Neisseria Gonorrhea Negative    Chlamydia Negative    Trichomonas Negative    Adequacy      Satisfactory for evaluation; transformation zone component ABSENT.   Diagnosis      - Negative for intraepithelial lesion or malignancy (NILM)   Comment Normal Reference Ranger Chlamydia - Negative    Comment      Normal Reference Range Neisseria Gonorrhea - Negative   Comment Normal Reference Range Trichomonas - Negative        Pertinent labs & imaging results that were available during my care of the patient were reviewed by me and considered in my medical decision making.  Assessment & Plan:  Colleen Mason was seen today for std check .  Diagnoses and all orders for this visit:  Unprotected sexual intercourse -     Urine cytology ancillary only -     WET PREP FOR TRICH, YEAST, CLUE -     GC/Chlamydia probe amp ([facility])not at [facility]      Screening for sexually transmitted infections (STIs) Requests STI screening due to potential exposure from ex-husband. No current STI symptoms, but reports vaginal odor. Ex-husband has not disclosed  any STI diagnosis. Denies oral sores or symptoms suggestive of oral STIs. No possibility of pregnancy as last menstrual cycle ended yesterday. - Perform urine test for gonorrhea, chlamydia, and trichomonas - Perform throat swab for STI screening - Declined blood  work for hepatitis C, HIV, and syphilis  Vaginal odor Reports vaginal odor without associated discharge or other symptoms. - Perform wet prep to evaluate for bacterial vaginosis or other infections          Continue all other maintenance medications.  Follow up plan: Return if symptoms worsen or fail to improve.   Continue healthy lifestyle choices, including diet (rich in fruits, vegetables, and lean proteins, and low in salt and simple carbohydrates) and exercise (at least 30 minutes of moderate physical activity daily).  Educational handout given for safe sex  The above assessment and management plan was discussed with the patient. The patient verbalized understanding of and has agreed to the management plan. Patient is aware to call the clinic if they develop any new symptoms or if symptoms persist or worsen. Patient is aware when to return to the clinic for a follow-up visit. Patient educated on when it is appropriate to go to the emergency department.   Colleen Bruns, FNP-C [facility] [phone]

## 2024-05-31 LAB — URINE CYTOLOGY ANCILLARY ONLY
Chlamydia: NEGATIVE
Comment: NEGATIVE
Comment: NEGATIVE
Comment: NORMAL
Neisseria Gonorrhea: NEGATIVE
Trichomonas: NEGATIVE

## 2024-05-31 LAB — GC/CHLAMYDIA PROBE AMP (~~LOC~~) NOT AT ARMC
Chlamydia: NEGATIVE
Comment: NEGATIVE
Comment: NORMAL
Neisseria Gonorrhea: NEGATIVE

## 2024-06-16 ENCOUNTER — Other Ambulatory Visit: Payer: Self-pay | Admitting: Family Medicine

## 2024-06-16 DIAGNOSIS — F411 Generalized anxiety disorder: Secondary | ICD-10-CM

## 2024-08-08 ENCOUNTER — Ambulatory Visit: Payer: Self-pay | Admitting: Dermatology

## 2024-08-08 ENCOUNTER — Encounter: Payer: Self-pay | Admitting: Dermatology

## 2024-08-08 VITALS — BP 122/77

## 2024-08-08 DIAGNOSIS — B360 Pityriasis versicolor: Secondary | ICD-10-CM

## 2024-08-08 MED ORDER — KETOCONAZOLE 2 % EX CREA: 1.0000 | TOPICAL_CREAM | Freq: Two times a day (BID) | CUTANEOUS | 10 refills | Status: DC

## 2024-08-08 NOTE — Patient Instructions (Addendum)
 VISIT SUMMARY:  During your visit, we discussed the persistent rash on your back that you have had for a couple of years. The rash was initially widespread and scaly but has now reduced to a single pink spot. We reviewed your previous treatment with fluconazole  and discussed a new treatment plan.  YOUR PLAN:  -TINEA VERSICOLOR:  Tinea versicolor is a common fungal infection that causes small, discolored patches of skin. It is often triggered by sweating, hormonal changes, or weather.   We have prescribed a topical ketoconazole cream for you to apply daily until the rash resolves, which may take 3-4 weeks. As a preventive measure, you should use the cream weekly in early spring.   We have provided you with 8 refills for a year's supply. Additionally, you may use zinc-based products like Head and Shoulders for extra management. Please contact the clinic if the rash does not resolve after 4 weeks of treatment or if you notice any new symptoms.  INSTRUCTIONS:  Please follow the treatment plan as discussed. Apply the ketoconazole cream daily until the rash resolves, which may take 3-4 weeks. Use the cream weekly in early spring as a preventive measure. You have 8 refills for a year's supply. If the rash does not resolve after 4 weeks or if you notice any new symptoms, contact the clinic. Important Information  Due to recent changes in healthcare laws, you may see results of your pathology and/or laboratory studies on MyChart before the doctors have had a chance to review them. We understand that in some cases there may be results that are confusing or concerning to you. Please understand that not all results are received at the same time and often the doctors may need to interpret multiple results in order to provide you with the best plan of care or course of treatment. Therefore, we ask that you please give us  2 business days to thoroughly review all your results before contacting the office for  clarification. Should we see a critical lab result, you will be contacted sooner.   If You Need Anything After Your Visit  If you have any questions or concerns for your doctor, please call our main line at [phone] If no one answers, please leave a voicemail as directed and we will return your call as soon as possible. Messages left after 4 pm will be answered the following business day.   You may also send us  a message via MyChart. We typically respond to MyChart messages within 1-2 business days.  For prescription refills, please ask your pharmacy to contact our office. Our fax number is [phone].  If you have an urgent issue when the clinic is closed that cannot wait until the next business day, you can page your doctor at the number below.    Please note that while we do our best to be available for urgent issues outside of office hours, we are not available 24/7.   If you have an urgent issue and are unable to reach us , you may choose to seek medical care at your doctor's office, retail clinic, urgent care center, or emergency room.  If you have a medical emergency, please immediately call 911 or go to the emergency department. In the event of inclement weather, please call our main line at [phone] for an update on the status of any delays or closures.  Dermatology Medication Tips: Please keep the boxes that topical medications come in in order to help keep track of the instructions about  where and how to use these. Pharmacies typically print the medication instructions only on the boxes and not directly on the medication tubes.   If your medication is too expensive, please contact our office at [phone] or send us  a message through MyChart.   We are unable to tell what your co-pay for medications will be in advance as this is different depending on your insurance coverage. However, we may be able to find a substitute medication at lower cost or fill out paperwork to  get insurance to cover a needed medication.   If a prior authorization is required to get your medication covered by your insurance company, please allow us  1-2 business days to complete this process.  Drug prices often vary depending on where the prescription is filled and some pharmacies may offer cheaper prices.  The website www.goodrx.com contains coupons for medications through different pharmacies. The prices here do not account for what the cost may be with help from insurance (it may be cheaper with your insurance), but the website can give you the price if you did not use any insurance.  - You can print the associated coupon and take it with your prescription to the pharmacy.  - You may also stop by our office during regular business hours and pick up a GoodRx coupon card.  - If you need your prescription sent electronically to a different pharmacy, notify our office through [facility] or by phone at [phone]

## 2024-08-08 NOTE — Progress Notes (Signed)
   New Patient Visit   Subjective  Colleen Mason is a 23 y.o. female who presents for a NEW PATIENT appointment to be examined for the concerns as listed below.   Rash: Pt has a rash on her back where the bra sits that she presented in 2023. She stated that it was previously under the breast but has resolved. She has tried fluconazole  tablets - one tab a wee for 4 weeks at the beginning of the year. It helped some but not fully so her PCP sent referral to dermatology.    Are you nursing, pregnant or trying to conceive? No   The following portions of the chart were reviewed this encounter and updated as appropriate: medications, allergies, medical history  Review of Systems:  No other skin or systemic complaints except as noted in HPI or Assessment and Plan.  Objective  Well appearing patient in no apparent distress; mood and affect are within normal limits.   A focused examination was performed of the following areas: back   Relevant exam findings are noted in the Assessment and Plan.         Assessment & Plan    Tinea versicolor Chronic tinea versicolor with residual pink patch on the back, previously widespread and scaly. Initial treatment with fluconazole  was partially effective. Current presentation is consistent with tinea versicolor, characterized by pink, salmon-colored scaly patches. Condition is recurrent, often triggered by sweating, hormonal changes, or weather. No cure, but manageable with antifungal treatment.  - Prescribed topical ketoconazole cream with instructions to apply daily until resolution, which may take 3-4 weeks. - Advised use of ketoconazole cream weekly in early spring as a preventive measure. - Provided 8 refills for a year's supply of ketoconazole cream. - Discussed optional use of zinc-based products like Head and Shoulders for additional management. - Instructed to contact the clinic if the rash does not resolve after 4 weeks of treatment or if  new symptoms arise.     No follow-ups on file.   Documentation: I have reviewed the above documentation for accuracy and completeness, and I agree with the above.  I, Shirron Maranda, CMA II, am acting as scribe for:   Delon Lenis, DO

## 2024-08-15 ENCOUNTER — Ambulatory Visit (HOSPITAL_COMMUNITY): Admitting: Psychiatry

## 2024-10-05 NOTE — Progress Notes (Signed)
 "  Subjective: CC: Follow-up anxiety disorder PCP: Jolinda Norene HERO, DO YEP:Jwwj Reddington is a 24 y.o. female presenting to clinic today for:  Patient here for generalized anxiety disorder.  [place]  [place] was advised to reduce Pristiq  to 25 mg and Lexapro  10 mg was added.  She reports some improved symptomology with the Lexapro  addition and actually has been off of the Pristiq  for the last several days.  No reports of any withdrawal symptoms.  Mood has been stable.  She is getting a divorce from her spouse and is currently in a new, healthy and safe relationship.  She reports no concerns.  She is seeing counseling virtually regularly.  She would like to reestablish with a new psychiatrist as her health insurance will be changing and [place]  Last menstrual cycle was 09/11/2024.   ROS: Per HPI  Allergies[1] Past Medical History:  Diagnosis Date   Allergy    Anxiety    Asthma    Auditory processing disorder    Hyperacusis    Memory difficulty    tolerance fading memory per mother   Raynaud disease    Current Medications[2] Social History   Socioeconomic History   Marital status: Single    Spouse name: Not on file   Number of children: Not on file   Years of education: Not on file   Highest education level: Not on file  Occupational History   Not on file  Tobacco Use   Smoking status: Never    Passive exposure: Never   Smokeless tobacco: Never  Vaping Use   Vaping status: Never Used  Substance and Sexual Activity   Alcohol use: Never   Drug use: Never   Sexual activity: Yes    Birth control/protection: Pill  Other Topics Concern   Not on file  Social History Narrative   Not on file   Social Drivers of Health   Tobacco Use: Low Risk (08/08/2024)   Patient History    Smoking Tobacco Use: Never    Smokeless Tobacco Use: Never    Passive Exposure: Never   Financial Resource Strain: Not on file  Food Insecurity: Not on file  Transportation Needs: Not on file  Physical Activity: Not on file  Stress: Not on file  Social Connections: Not on file  Intimate Partner Violence: Not on file  Depression (PHQ2-9): Low Risk (05/30/2024)   Depression (PHQ2-9)    PHQ-2 Score: 1  Alcohol Screen: Not on file  Housing: Not on file  Utilities: Not on file  Health Literacy: Not on file   Family History  Problem Relation Age of Onset   Raynaud syndrome Mother    Asthma Mother    Arthritis Mother        Psoriatic or RA   Asthma Father    Raynaud syndrome Sister    Vitiligo Brother    Arthritis Maternal Grandmother    Thyroid  disease Maternal Grandfather    Rheum arthritis Maternal Grandfather    Heart attack Maternal Grandfather    Heart disease Maternal Grandfather    Pancreatic cancer Paternal Grandmother    Diabetes Paternal Grandmother    Prostate cancer Paternal Grandfather    Diabetes Paternal Grandfather    Breast cancer Other    Diabetes Other    Cancer Other        Unknown type   Diabetes Other  Diabetes Maternal Aunt     Objective: Office vital signs reviewed. BP 109/69   Pulse 81   Temp 98.3 F (36.8 C)   Ht 5' 5 (1.651 m)   Wt 116 lb 8 oz (52.8 kg)   SpO2 100%   BMI 19.39 kg/m   Physical Examination:  General: Awake, alert, well nourished, No acute distress HEENT: sclera white, MMM Cardio: regular rate and rhythm, S1S2 heard, no murmurs appreciated Pulm: clear to auscultation bilaterally, no wheezes, rhonchi or rales; normal work of breathing on room air Psych: Mood stable, speech normal, affect appropriate.     10/06/2024   11:42 AM 05/30/2024    8:32 AM 04/07/2024   12:30 PM  Depression screen PHQ 2/9  Decreased Interest 0 0 0  Down, Depressed, Hopeless 1 0 1  PHQ - 2 Score 1 0 1  Altered sleeping 1 0   Tired, decreased energy 1 1   Change in appetite 0 0   Feeling bad or failure about yourself  0 0    Trouble concentrating 0 0   Moving slowly or fidgety/restless 0 0   Suicidal thoughts 0 0   PHQ-9 Score 3 1    Difficult doing work/chores  Not difficult at all      Data saved with a previous flowsheet row definition      10/06/2024   11:43 AM 05/30/2024    8:32 AM 04/07/2024   12:30 PM 10/18/2023    3:51 PM  GAD 7 : Generalized Anxiety Score  Nervous, Anxious, on Edge 1 1  1  3    Control/stop worrying 0 1  1  1    Worry too much - different things 0 1  1  0   Trouble relaxing 0 0  0  1   Restless 0 0  0  0   Easily annoyed or irritable 1 0  1  1   Afraid - awful might happen 0 0  0  0   Total GAD 7 Score 2 3 4 6   Anxiety Difficulty Not difficult at all Not difficult at all Not difficult at all Somewhat difficult     Data saved with a previous flowsheet row definition    Assessment/ Plan: 24 y.o. female   Generalized anxiety disorder - Plan: escitalopram  (LEXAPRO ) 10 MG tablet, Ambulatory referral to Psychiatry   Clinically doing better on the Lexapro  alone.  I have gone ahead and renewed that 10 mg tablet.  Okay to discontinue Pristiq  since she is doing well off of it.  I went ahead and placed referral to psychiatry as she still wanted to establish care just to have access to psychiatry should she need it.  Continue counseling.  She may follow-up with me in 1 year, sooner if concerns arise   Norene CHRISTELLA Fielding, DO [facility] [phone]     [1]  Allergies Allergen Reactions   Penicillins Hives  [2]  Current Outpatient Medications:    albuterol  (VENTOLIN  HFA) 108 (90 Base) MCG/ACT inhaler, INHALE 2 PUFFS INTO THE LUNGS EVERY 6 HOURS AS NEEDED FOR WHEEZING OR SHORTNESS OF BREATH, Disp: 18 g, Rfl: 4   cetirizine  (ZYRTEC ) 10 MG tablet, Take 1 tablet (10 mg total) by mouth daily., Disp: 30 tablet, Rfl: 11   desvenlafaxine  (PRISTIQ ) 50 MG 24 hr tablet, TAKE 1 TABLET(50 MG) BY MOUTH DAILY, Disp: 30 tablet, Rfl: 1   Fluticasone Propionate , Inhal,  (FLOVENT  DISKUS) 100 MCG/ACT AEPB, Inhale 1 puff into  the lungs in the morning and at bedtime., Disp: 90 each, Rfl: 3   ketoconazole  (NIZORAL ) 2 % cream, Apply 1 Application topically 2 (two) times daily. Use until clear, Disp: 60 g, Rfl: 10  "

## 2024-10-06 ENCOUNTER — Ambulatory Visit (INDEPENDENT_AMBULATORY_CARE_PROVIDER_SITE_OTHER): Admitting: Family Medicine

## 2024-10-06 ENCOUNTER — Encounter: Payer: Self-pay | Admitting: Family Medicine

## 2024-10-06 VITALS — BP 109/69 | HR 81 | Temp 98.3°F | Ht 65.0 in | Wt 116.5 lb

## 2024-10-06 DIAGNOSIS — F411 Generalized anxiety disorder: Secondary | ICD-10-CM

## 2024-10-06 MED ORDER — ESCITALOPRAM OXALATE 10 MG PO TABS: 10.0000 mg | ORAL_TABLET | Freq: Every day | ORAL | 4 refills | Status: AC

## 2024-11-13 ENCOUNTER — Ambulatory Visit: Admitting: Family Medicine

## 2025-01-26 ENCOUNTER — Encounter: Payer: Self-pay | Admitting: Family Medicine
